# Patient Record
Sex: Female | Born: 1980
Health system: Southern US, Community
[De-identification: ages and names within clinical notes are randomized; demographics above are authoritative.]

## PROBLEM LIST (undated history)

## (undated) ENCOUNTER — Emergency Department (HOSPITAL_COMMUNITY): Discharge status: Absent without leave | Payer: Self-pay

## (undated) DIAGNOSIS — K625 Hemorrhage of anus and rectum: Secondary | ICD-10-CM

## (undated) DIAGNOSIS — Z8719 Personal history of other diseases of the digestive system: Secondary | ICD-10-CM

## (undated) DIAGNOSIS — A599 Trichomoniasis, unspecified: Secondary | ICD-10-CM

## (undated) DIAGNOSIS — I499 Cardiac arrhythmia, unspecified: Secondary | ICD-10-CM

## (undated) DIAGNOSIS — K5900 Constipation, unspecified: Secondary | ICD-10-CM

## (undated) HISTORY — PX: BREAST SURGERY: SHX581

## (undated) HISTORY — PX: OTHER SURGICAL HISTORY: SHX169

---

## 2004-02-28 ENCOUNTER — Emergency Department (HOSPITAL_COMMUNITY): Admission: EM | Admit: 2004-02-28 | Discharge: 2004-02-28 | Payer: Self-pay | Admitting: Emergency Medicine

## 2004-03-16 ENCOUNTER — Emergency Department (HOSPITAL_COMMUNITY): Admission: EM | Admit: 2004-03-16 | Discharge: 2004-03-16 | Payer: Self-pay | Admitting: Emergency Medicine

## 2004-06-30 ENCOUNTER — Emergency Department (HOSPITAL_COMMUNITY): Admission: EM | Admit: 2004-06-30 | Discharge: 2004-06-30 | Payer: Self-pay | Admitting: Emergency Medicine

## 2004-07-27 ENCOUNTER — Emergency Department (HOSPITAL_COMMUNITY): Admission: EM | Admit: 2004-07-27 | Discharge: 2004-07-27 | Payer: Self-pay | Admitting: Emergency Medicine

## 2004-08-10 ENCOUNTER — Emergency Department (HOSPITAL_COMMUNITY): Admission: EM | Admit: 2004-08-10 | Discharge: 2004-08-10 | Payer: Self-pay | Admitting: Emergency Medicine

## 2004-09-13 ENCOUNTER — Emergency Department (HOSPITAL_COMMUNITY): Admission: EM | Admit: 2004-09-13 | Discharge: 2004-09-13 | Payer: Self-pay | Admitting: Emergency Medicine

## 2004-09-23 ENCOUNTER — Emergency Department (HOSPITAL_COMMUNITY): Admission: EM | Admit: 2004-09-23 | Discharge: 2004-09-23 | Payer: Self-pay | Admitting: Emergency Medicine

## 2004-09-30 ENCOUNTER — Ambulatory Visit (HOSPITAL_COMMUNITY): Admission: RE | Admit: 2004-09-30 | Discharge: 2004-09-30 | Payer: Self-pay | Admitting: Family Medicine

## 2004-11-04 ENCOUNTER — Ambulatory Visit (HOSPITAL_COMMUNITY): Admission: RE | Admit: 2004-11-04 | Discharge: 2004-11-04 | Payer: Self-pay | Admitting: Obstetrics and Gynecology

## 2004-11-24 ENCOUNTER — Emergency Department (HOSPITAL_COMMUNITY): Admission: EM | Admit: 2004-11-24 | Discharge: 2004-11-24 | Payer: Self-pay | Admitting: Emergency Medicine

## 2004-11-30 ENCOUNTER — Emergency Department (HOSPITAL_COMMUNITY): Admission: EM | Admit: 2004-11-30 | Discharge: 2004-11-30 | Payer: Self-pay | Admitting: Emergency Medicine

## 2005-02-18 ENCOUNTER — Encounter: Admission: RE | Admit: 2005-02-18 | Discharge: 2005-02-18 | Payer: Self-pay | Admitting: Family Medicine

## 2005-03-02 ENCOUNTER — Emergency Department (HOSPITAL_COMMUNITY): Admission: EM | Admit: 2005-03-02 | Discharge: 2005-03-02 | Payer: Self-pay | Admitting: Emergency Medicine

## 2005-03-05 ENCOUNTER — Ambulatory Visit (HOSPITAL_BASED_OUTPATIENT_CLINIC_OR_DEPARTMENT_OTHER): Admission: RE | Admit: 2005-03-05 | Discharge: 2005-03-05 | Payer: Self-pay | Admitting: Ophthalmology

## 2005-08-12 ENCOUNTER — Emergency Department (HOSPITAL_COMMUNITY): Admission: EM | Admit: 2005-08-12 | Discharge: 2005-08-12 | Payer: Self-pay | Admitting: Emergency Medicine

## 2005-11-22 ENCOUNTER — Encounter (INDEPENDENT_AMBULATORY_CARE_PROVIDER_SITE_OTHER): Payer: Self-pay | Admitting: Internal Medicine

## 2005-11-22 LAB — CONVERTED CEMR LAB

## 2006-02-06 ENCOUNTER — Emergency Department (HOSPITAL_COMMUNITY): Admission: EM | Admit: 2006-02-06 | Discharge: 2006-02-06 | Payer: Self-pay | Admitting: Emergency Medicine

## 2006-03-05 ENCOUNTER — Emergency Department (HOSPITAL_COMMUNITY): Admission: EM | Admit: 2006-03-05 | Discharge: 2006-03-05 | Payer: Self-pay | Admitting: Emergency Medicine

## 2006-04-01 ENCOUNTER — Emergency Department (HOSPITAL_COMMUNITY): Admission: EM | Admit: 2006-04-01 | Discharge: 2006-04-01 | Payer: Self-pay | Admitting: Emergency Medicine

## 2006-05-15 ENCOUNTER — Ambulatory Visit: Payer: Self-pay | Admitting: Internal Medicine

## 2006-05-18 ENCOUNTER — Ambulatory Visit: Payer: Self-pay | Admitting: Internal Medicine

## 2006-05-19 ENCOUNTER — Ambulatory Visit: Payer: Self-pay | Admitting: *Deleted

## 2006-07-09 ENCOUNTER — Ambulatory Visit: Payer: Self-pay | Admitting: Internal Medicine

## 2006-07-23 ENCOUNTER — Ambulatory Visit: Payer: Self-pay | Admitting: Internal Medicine

## 2006-09-22 ENCOUNTER — Emergency Department (HOSPITAL_COMMUNITY): Admission: EM | Admit: 2006-09-22 | Discharge: 2006-09-22 | Payer: Self-pay | Admitting: Family Medicine

## 2006-10-22 ENCOUNTER — Ambulatory Visit (HOSPITAL_COMMUNITY): Admission: RE | Admit: 2006-10-22 | Discharge: 2006-10-22 | Payer: Self-pay | Admitting: Internal Medicine

## 2006-10-22 ENCOUNTER — Ambulatory Visit: Payer: Self-pay | Admitting: Internal Medicine

## 2006-10-23 ENCOUNTER — Encounter (INDEPENDENT_AMBULATORY_CARE_PROVIDER_SITE_OTHER): Payer: Self-pay | Admitting: Internal Medicine

## 2006-10-30 ENCOUNTER — Ambulatory Visit: Payer: Self-pay | Admitting: Internal Medicine

## 2006-11-03 ENCOUNTER — Encounter: Payer: Self-pay | Admitting: Internal Medicine

## 2006-11-13 ENCOUNTER — Ambulatory Visit: Payer: Self-pay | Admitting: Internal Medicine

## 2006-11-13 LAB — CONVERTED CEMR LAB
Chlamydia, DNA Probe: NEGATIVE
GC Probe Amp, Genital: NEGATIVE

## 2006-12-09 ENCOUNTER — Encounter (INDEPENDENT_AMBULATORY_CARE_PROVIDER_SITE_OTHER): Payer: Self-pay | Admitting: *Deleted

## 2007-02-12 ENCOUNTER — Ambulatory Visit: Payer: Self-pay | Admitting: Internal Medicine

## 2007-02-12 LAB — CONVERTED CEMR LAB
Bilirubin Urine: NEGATIVE
Glucose, Urine, Semiquant: NEGATIVE
Ketones, urine, test strip: NEGATIVE
Nitrite: NEGATIVE
Protein, U semiquant: NEGATIVE
Specific Gravity, Urine: 1.015
Urobilinogen, UA: NEGATIVE
WBC Urine, dipstick: NEGATIVE
pH: 6.5

## 2007-02-23 ENCOUNTER — Telehealth (INDEPENDENT_AMBULATORY_CARE_PROVIDER_SITE_OTHER): Payer: Self-pay | Admitting: Internal Medicine

## 2007-05-27 ENCOUNTER — Ambulatory Visit: Payer: Self-pay | Admitting: Internal Medicine

## 2007-09-03 ENCOUNTER — Emergency Department (HOSPITAL_COMMUNITY): Admission: EM | Admit: 2007-09-03 | Discharge: 2007-09-03 | Payer: Self-pay | Admitting: Family Medicine

## 2007-09-14 ENCOUNTER — Ambulatory Visit: Payer: Self-pay | Admitting: Gastroenterology

## 2007-09-15 ENCOUNTER — Encounter: Payer: Self-pay | Admitting: Gastroenterology

## 2007-09-15 LAB — CONVERTED CEMR LAB
ALT: 19 units/L (ref 0–35)
AST: 19 units/L (ref 0–37)
Albumin: 4.2 g/dL (ref 3.5–5.2)
Alkaline Phosphatase: 63 units/L (ref 39–117)
BUN: 15 mg/dL (ref 6–23)
Basophils Absolute: 0 10*3/uL (ref 0.0–0.1)
Basophils Relative: 0 % (ref 0.0–1.0)
CO2: 25 meq/L (ref 19–32)
Calcium: 9.4 mg/dL (ref 8.4–10.5)
Chloride: 106 meq/L (ref 96–112)
Creatinine, Ser: 0.8 mg/dL (ref 0.4–1.2)
Eosinophils Absolute: 0.1 10*3/uL (ref 0.0–0.7)
Eosinophils Relative: 1.4 % (ref 0.0–5.0)
GFR calc Af Amer: 112 mL/min
GFR calc non Af Amer: 92 mL/min
Glucose, Bld: 96 mg/dL (ref 70–99)
HCT: 38.4 % (ref 36.0–46.0)
Hemoglobin: 13.4 g/dL (ref 12.0–15.0)
Lymphocytes Relative: 34 % (ref 12.0–46.0)
MCHC: 35 g/dL (ref 30.0–36.0)
MCV: 83.9 fL (ref 78.0–100.0)
Monocytes Absolute: 0.4 10*3/uL (ref 0.1–1.0)
Monocytes Relative: 8.5 % (ref 3.0–12.0)
Neutro Abs: 2.4 10*3/uL (ref 1.4–7.7)
Neutrophils Relative %: 56.1 % (ref 43.0–77.0)
Platelets: 188 10*3/uL (ref 150–400)
Potassium: 3.4 meq/L — ABNORMAL LOW (ref 3.5–5.1)
RBC: 4.58 M/uL (ref 3.87–5.11)
RDW: 12.9 % (ref 11.5–14.6)
Sodium: 142 meq/L (ref 135–145)
TSH: 0.77 microintl units/mL (ref 0.35–5.50)
Total Bilirubin: 1 mg/dL (ref 0.3–1.2)
Total Protein: 8.2 g/dL (ref 6.0–8.3)
WBC: 4.4 10*3/uL — ABNORMAL LOW (ref 4.5–10.5)

## 2007-10-29 ENCOUNTER — Ambulatory Visit: Payer: Self-pay | Admitting: Gastroenterology

## 2007-11-22 ENCOUNTER — Other Ambulatory Visit: Admission: RE | Admit: 2007-11-22 | Discharge: 2007-11-22 | Payer: Self-pay | Admitting: Obstetrics and Gynecology

## 2007-11-23 ENCOUNTER — Ambulatory Visit: Payer: Self-pay | Admitting: Gastroenterology

## 2007-11-23 ENCOUNTER — Encounter: Payer: Self-pay | Admitting: Gastroenterology

## 2007-11-25 ENCOUNTER — Encounter: Payer: Self-pay | Admitting: Gastroenterology

## 2008-01-07 ENCOUNTER — Ambulatory Visit: Payer: Self-pay | Admitting: Gastroenterology

## 2008-04-10 ENCOUNTER — Emergency Department (HOSPITAL_COMMUNITY): Admission: EM | Admit: 2008-04-10 | Discharge: 2008-04-10 | Payer: Self-pay | Admitting: Emergency Medicine

## 2008-05-25 ENCOUNTER — Emergency Department (HOSPITAL_COMMUNITY): Admission: EM | Admit: 2008-05-25 | Discharge: 2008-05-25 | Payer: Self-pay | Admitting: Emergency Medicine

## 2009-03-26 LAB — CONVERTED CEMR LAB: Pap Smear: NORMAL

## 2009-05-14 ENCOUNTER — Emergency Department (HOSPITAL_COMMUNITY): Admission: EM | Admit: 2009-05-14 | Discharge: 2009-05-14 | Payer: Self-pay | Admitting: Emergency Medicine

## 2009-05-20 ENCOUNTER — Emergency Department (HOSPITAL_COMMUNITY): Admission: EM | Admit: 2009-05-20 | Discharge: 2009-05-20 | Payer: Self-pay | Admitting: Family Medicine

## 2009-05-29 ENCOUNTER — Emergency Department (HOSPITAL_COMMUNITY): Admission: EM | Admit: 2009-05-29 | Discharge: 2009-05-29 | Payer: Self-pay | Admitting: Emergency Medicine

## 2009-06-01 ENCOUNTER — Emergency Department (HOSPITAL_COMMUNITY): Admission: EM | Admit: 2009-06-01 | Discharge: 2009-06-02 | Payer: Self-pay | Admitting: Emergency Medicine

## 2009-06-04 ENCOUNTER — Ambulatory Visit: Payer: Self-pay | Admitting: Family Medicine

## 2009-06-04 ENCOUNTER — Encounter: Payer: Self-pay | Admitting: Family Medicine

## 2009-06-04 DIAGNOSIS — G4700 Insomnia, unspecified: Secondary | ICD-10-CM | POA: Insufficient documentation

## 2009-06-05 LAB — CONVERTED CEMR LAB: TSH: 1.081 microintl units/mL (ref 0.350–4.500)

## 2009-06-14 ENCOUNTER — Ambulatory Visit: Payer: Self-pay | Admitting: Family Medicine

## 2009-09-21 ENCOUNTER — Ambulatory Visit: Payer: Self-pay | Admitting: Family Medicine

## 2009-09-21 DIAGNOSIS — N390 Urinary tract infection, site not specified: Secondary | ICD-10-CM | POA: Insufficient documentation

## 2009-09-21 DIAGNOSIS — N899 Noninflammatory disorder of vagina, unspecified: Secondary | ICD-10-CM | POA: Insufficient documentation

## 2009-09-21 LAB — CONVERTED CEMR LAB
Bilirubin Urine: NEGATIVE
Glucose, Urine, Semiquant: NEGATIVE
Nitrite: NEGATIVE
Protein, U semiquant: 30
Specific Gravity, Urine: >=1.03
Urobilinogen, UA: 0.2
WBC Urine, dipstick: NEGATIVE
pH: 6

## 2009-09-22 ENCOUNTER — Encounter: Payer: Self-pay | Admitting: Family Medicine

## 2009-10-03 ENCOUNTER — Ambulatory Visit: Payer: Self-pay | Admitting: Family Medicine

## 2010-01-31 ENCOUNTER — Emergency Department (HOSPITAL_COMMUNITY): Admission: EM | Admit: 2010-01-31 | Discharge: 2010-01-31 | Payer: Self-pay | Admitting: Family Medicine

## 2010-03-19 ENCOUNTER — Emergency Department (HOSPITAL_COMMUNITY)
Admission: EM | Admit: 2010-03-19 | Discharge: 2010-03-19 | Payer: Self-pay | Source: Home / Self Care | Admitting: Family Medicine

## 2010-04-16 ENCOUNTER — Emergency Department (HOSPITAL_COMMUNITY)
Admission: EM | Admit: 2010-04-16 | Discharge: 2010-04-16 | Payer: Self-pay | Source: Home / Self Care | Admitting: Family Medicine

## 2010-04-23 NOTE — Assessment & Plan Note (Signed)
Summary: 30yo F new pt- insomnia   Vital Signs:  Patient profile:   30 year old female Height:      63.5 inches Weight:      149.4 pounds BMI:     26.14 Temp:     98.9 degrees F oral Pulse rate:   90 / minute BP sitting:   113 / 76  (left arm) Cuff size:   regular  Vitals Entered By: Gladstone Pih (June 04, 2009 3:20 PM) CC: NP get established, having problems sleeping since mid Jan 2011 Is Patient Diabetic? No Pain Assessment Patient in pain? no        Primary Care Provider:  Marisue Ivan, MD  CC:  NP get established and having problems sleeping since mid Jan 2011.  History of Present Illness: 30yo F here to establish care  Concerns:  Insomnia: Difficult falling asleep since middle of Jan 2011.  Occurs every night.  Has been taking off-brand Unisom and occasional Tylenol PM  to help with sleep.  She has noticed worsening headaches since not being able to sleep.  She has had a CT scan of the head that was normal.  Good sleep hygiene endorse.  No caffeine or exercise in the evening.    Preventative: [facility]    Habits & Providers  Alcohol-Tobacco-Diet     Tobacco Status: never  Current Medications (verified): 1)  None  Allergies (verified): No Known Drug Allergies  Past History:  Past Medical History: Hx of IBS- resolved (Colonoscopy 11/2007) G1P1001  Social History: Lives in [place] with son, born in 2003 Works at [employer], Nature conservation officer nonsmoker nondrinker no drug use Occasional exercise  Review of Systems  The patient denies fever, chest pain, and syncope.    Physical Exam  General:  VS Reviewed. Well appearing, NAD.  Eyes:  no injected conjunctiva Neck:  supple, full ROM, no goiter or mass  Lungs:  Normal respiratory effort, chest expands symmetrically. Lungs are clear to auscultation, no crackles or wheezes. Heart:  Normal rate and regular rhythm. S1 and S2 normal without gallop, murmur, click, rub or  other extra sounds. Abdomen:  Soft, NT, ND, no HSM, active BS  Extremities:  no edema Neurologic:  no focal deficits Skin:  nl color and turgor   Impression & Recommendations:  Problem # 1:  HEALTH MAINTENANCE EXAM (ICD-V70.0) Assessment Unchanged Nl exam.   Will obtain [facility] from [facility] Seems to be up to date on pap smear.  Problem # 2:  INSOMNIA (ICD-780.52) Assessment: Deteriorated Lengthy discussion regarding sleep habits. Plan to place on sleep regimen.  Inc exercise.  Will start melatonin per pt request.  Ambien as needed. check TSH f/u in 2 weeks.  Her updated medication list for this problem includes:    Ambien 5 Mg Tabs (Zolpidem tartrate) .Marland Kitchen... 1 tablet by mouth at bedtime for insomnia  Orders: TSH-[facility] ([id number])  Complete Medication List: 1)  Depo Injection  .... Injection every 3 months 2)  Melatonin 5 Mg Tabs (Melatonin) .Marland Kitchen.. 1 tab by mouth at bedtime 3)  Ambien 5 Mg Tabs (Zolpidem tartrate) .Marland Kitchen.. 1 tablet by mouth at bedtime for insomnia  Other Orders: Tdap => 13yrs IM (57322) Admin 1st Vaccine (02542)  Patient Instructions: 1)  Please schedule a follow-up appointment in 2 weeks.  2)  I started you on Melatonin and ambien for the insomnia. 3)  I want you to try to exercise at least 30 minutes but ideally 1 hour  each day.   4)  Avoid doing anything besides sleeping in bed. 5)  Go to bed at the same time each night.   Prescriptions: AMBIEN 5 MG TABS (ZOLPIDEM TARTRATE) 1 tablet by mouth at bedtime for insomnia  #30 x 1   Entered and Authorized by:   Marisue Ivan  MD   Signed by:   Marisue Ivan  MD on 06/04/2009   Method used:   Print then Give to Patient   RxID:   [id number] MELATONIN 5 MG TABS (MELATONIN) 1 tab by mouth at bedtime  #30 x 1   Entered and Authorized by:   Marisue Ivan  MD   Signed by:   Marisue Ivan  MD on 06/04/2009   Method used:   Electronically to        [facility] [id number] (retail)        [address] [place], [place]  [address]       Ph: [phone]       Fax: [phone]   RxID:   [id number]    Prevention & Chronic Care Immunizations   Influenza vaccine: refused  (06/04/2009)   Influenza vaccine deferral: Refused  (06/04/2009)   Influenza vaccine due: Refused  (06/04/2009)    Tetanus booster: 06/04/2009: Tdap    Pneumococcal vaccine: Not documented  Other Screening   Pap smear: normal  (03/26/2009)   Pap smear due: 03/26/2010   Smoking status: never  (06/04/2009)   Nursing Instructions: Give tetanus booster today    Flu Vaccine Result Date:  06/04/2009 Flu Vaccine Result:  refused Flu Vaccine Next Due:  Refused Last PAP:  Done (11/22/2005 5:39:59 PM) PAP Result Date:  03/26/2009 PAP Result:  normal    Immunizations Administered:  Tetanus Vaccine:    Vaccine Type: Tdap    Site: right deltoid    Mfr: GlaxoSmithKline    Dose: 0.5 ml    Given by: Gladstone Pih    Exp. Date: 06/16/2011    Lot #: [id number]    VIS given: 02/09/07 version given June 04, 2009.    Physician counseled: yes

## 2010-04-23 NOTE — Assessment & Plan Note (Signed)
Summary: urethral irritation   Vital Signs:  Patient profile:   30 year old female Weight:      141.1 pounds Temp:     98.1 degrees F oral Pulse rate:   78 / minute BP sitting:   135 / 69  (left arm)  Vitals Entered By: Loralee Pacas CMA (October 03, 2009 1:57 PM) CC: Urinary Irritation, Flamily Planning Comments pt states that she is still having the vaginal issues.  it's still red and irritated when she urinates esp. when she wipes. it also bothers her when she is just sitting around it is uncomfortable.  there has been no trauma to this area   Primary Care Provider:  Jamie Brookes MD  CC:  Urinary Irritation and Flamily Planning.  History of Present Illness: Urinary Irritation: Pt comes back in today with similar symptoms that she had before. She is still red and irritated and having pain when she wipes herself and when she urinates. She has not had any trauma. No sexual relations since Feb. Symptoms started in March.   Family Planning: Pt says she is considering getting a different type of contraception starting in January. Thinking about having a child again someday but not sure when. Discussed implanon.   Habits & Providers  Alcohol-Tobacco-Diet     Tobacco Status: never  Current Medications (verified): 1)  Depo Injection .... Injection Every 3 Months 2)  Imipramine Hcl 25 Mg Tabs (Imipramine Hcl) .Marland Kitchen.. 1 Tab By Mouth Daily...titrate To 50mg  Per Md 3)  Terconazole 0.8 % Crea (Terconazole) .... Apply A Small Amount of The Cream To The Opening Around The Urethra.  Give 1 Large Tube  Allergies (verified): No Known Drug Allergies  Review of Systems        vitals reviewed and pertinent negatives and positives seen in HPI   Physical Exam  General:  Well-developed,well-nourished,in no acute distress; alert,appropriate and cooperative throughout examination Genitalia:  2 tiny pinpoint red dots and erythema around the opening of the urethra. very tender.  Psych:  Cognition  and judgment appear intact. Alert and cooperative with normal attention span and concentration. No apparent delusions, illusions, hallucinations   Impression & Recommendations:  Problem # 1:  UNSPECIFIED NONINFLAMMATORY DISORDER OF VAGINA (ICD-623.9) Assessment Deteriorated Considering canidia glabrata species as cause of irritation. Will try Teraconazole. Pt thinks she has tried this before but it willing to try it again.   Orders: [facility]- Est Level  3 ([id number])  Complete Medication List: 1)  Depo Injection  .... Injection every 3 months 2)  Imipramine Hcl 25 Mg Tabs (Imipramine hcl) .Marland Kitchen.. 1 tab by mouth daily...titrate to 50mg  per md 3)  Terconazole 0.8 % Crea (Terconazole) .... Apply a small amount of the cream to the opening around the urethra.  give 1 large tube  Patient Instructions: 1)  This may be caused by a different kind of yeast.  2)  Use the Teraconazole for 1 week and if symptoms are not relieved use for 1 more week.   3)  Come back to see me or make an appointment to see your GYN MD if not feeling a little better in the next 2 weeks.  Prescriptions: TERCONAZOLE 0.8 % CREA (TERCONAZOLE) apply a small amount of the cream to the opening around the urethra.  Give 1 large tube  #1 x 3   Entered and Authorized by:   Jamie Brookes MD   Signed by:   Jamie Brookes MD on 10/03/2009   Method used:  Electronically to        [facility] [id number] (retail)       [address] [place], [place]  [address]       Ph: [phone]       Fax: [phone]   RxID:   [id number]

## 2010-04-23 NOTE — Assessment & Plan Note (Signed)
Summary: Vaginal discomfort, Family planning   Vital Signs:  Patient profile:   30 year old female Weight:      142.9 pounds Temp:     99.5 degrees F oral Pulse rate:   96 / minute Pulse rhythm:   regular BP sitting:   108 / 72  (right arm) Cuff size:   regular  Vitals Entered By: Loralee Pacas CMA (September 21, 2009 3:13 PM)  Primary Care Provider:  Jamie Brookes MD  CC:  urinary irritation.  History of Present Illness: Urinary irritation: Pt has a long history of urinary irritation. She describes an uncomfortable feeling starting in March. She went to [facility]) and being diagnosed with a UTI. She was treated but did not feel better, she was then diagnosed with a yeast infection and treated with Diflucan. She was then diagnosed with BV and treated with Metrogel. She still had the irritation and was given 2 different creams, told to use Aveno baths and still has the irritation. She says she has had an "oder" for a long time that makes her embarrassed to even sit beside people because she is just sure they smell her vaginal oder. 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    Contraception: pt is currently using the Depo shots for contraceptions. She has been using them for > 5 years. Discussed using another method because of bone thinning. She says she knows the risks of bone thinning but wants to continue using them. Told her about other options. She will think about them.   Current Medications (verified): 1)  Depo Injection .... Injection Every 3 Months 2)  Imipramine Hcl 25 Mg Tabs (Imipramine Hcl) .Marland Kitchen.. 1 Tab By Mouth Daily...titrate To 50mg  Per Md  Allergies (verified): No Known Drug Allergies  Review of Systems        vitals reviewed and pertinent negatives and positives seen in HPI   Physical Exam  General:  Well-developed,well-nourished,in no acute distress; alert,appropriate and cooperative throughout examination Genitalia:  Normal introitus for  age, no external lesions, no vaginal discharge, mucosa pink and moist, no vaginal or cervical lesions, no vaginal atrophy, no friaility or hemorrhage, normal uterus size and position, no adnexal masses or tenderness, normal vaginal oder present   Impression & Recommendations:  Problem # 1:  UNSPECIFIED NONINFLAMMATORY DISORDER OF VAGINA (ICD-623.9) Assessment New UA was inconclusive for UTI. No oder smelled on exam, pt has been diagnosed with several different infections and been treated with several different kinds of treatments over the last 4 months. Not sure that there is a real irritation but may be a change in vaginal pH. Plan to have her do baking soda baths and see me back in 2 weeks to see if she is getting better. UA sent off for culture.   Orders: [facility]- Est Level  3 ([address]109)  Problem # 2:  FAMILY PLANNING (ICD-V25.09) Assessment: Unchanged Discussed wtih patient other options for contraceptions. She has been on Depo for too long. Pt is thinking about nuva ring. Will discuss further in 2 weeks at next appointment.   Orders: [facility]- Est Level  3 ([id number])  Complete Medication List: 1)  Depo Injection  .... Injection every 3 months 2)  Imipramine Hcl 25 Mg Tabs (Imipramine hcl) .Marland Kitchen.. 1 tab by mouth daily...titrate to 50mg  per md  Other Orders: Urinalysis-[facility] (00000) Urine Culture-[facility] ([id number])  Patient Instructions: 1)  It was really nice to meet you today.  2)  Thank you  for telling me your story. Hopefully this will help. 3)  You can look up several different ways to do baking soda baths but this is what I suggest: fill tub to the level to cover your legs, add 1/2 to 1 box of baking soda to the water, mix water. Sit in water for 20 minutes twice a day for 2 weeks, then come back to see me. If it works well you can just continue the baths twice a week.  4)  I will see you again in 2 weeks.   Laboratory Results   Urine Tests  Date/Time Received: September 21, 2009 3:17  PM  Date/Time Reported: September 21, 2009 4:01 PM   Routine Urinalysis   Color: yellow Appearance: Clear Glucose: negative   (Normal Range: Negative) Bilirubin: negative   (Normal Range: Negative) Ketone: trace (5)   (Normal Range: Negative) Spec. Gravity: >=1.030   (Normal Range: 1.003-1.035) Blood: small   (Normal Range: Negative) pH: 6.0   (Normal Range: 5.0-8.0) Protein: 30   (Normal Range: Negative) Urobilinogen: 0.2   (Normal Range: 0-1) Nitrite: negative   (Normal Range: Negative) Leukocyte Esterace: negative   (Normal Range: Negative)  Urine Microscopic WBC/HPF: 1-5 RBC/HPF: 5-10 Bacteria/HPF: 1+ Mucous/HPF: 2+ Epithelial/HPF: 1-5    Comments: urine sent for culture ...............test performed by......Marland KitchenBonnie A. Sw[place], MLS (ASCP)cm

## 2010-04-23 NOTE — Assessment & Plan Note (Signed)
Summary: Insomnia- no change   Vital Signs:  Patient profile:   30 year old female Height:      63.5 inches Weight:      147.0 pounds BMI:     25.72 Temp:     99.5 degrees F oral Pulse rate:   97 / minute BP sitting:   104 / 69  (left arm) Cuff size:   regular  Vitals Entered By: Gladstone Pih (June 14, 2009 4:13 PM) CC: F/U Is Patient Diabetic? No Pain Assessment Patient in pain? no        Primary Care Provider:  Marisue Ivan, MD  CC:  F/U.  History of Present Illness: 30yo F here for f/u insomnia  Insomnia: Tried the Ambien and melatonin x 1 week.  No drastic effect.  Now on imipramine per headache clinic and states that headache is better but sleep not drastically better.  No adverse effects.  She was on a sleep regimen x 1 week.  Has not increased exercise regimen.  Habits & Providers  Alcohol-Tobacco-Diet     Tobacco Status: never  Current Medications (verified): 1)  Depo Injection .... Injection Every 3 Months 2)  Imipramine Hcl 25 Mg Tabs (Imipramine Hcl) .Marland Kitchen.. 1 Tab By Mouth Daily...titrate To 50mg  Per Md  Allergies (verified): No Known Drug Allergies  Physical Exam  General:  VS Reviewed. Well appearing, NAD.    Impression & Recommendations:  Problem # 1:  INSOMNIA (ICD-780.52) Assessment Unchanged  Pt no longer on Ambien or melatonin.   It is possible that if her headaches continue to improve, her sleep may also improve. Emphasized good sleep hygiene and daily exercise.  Plan to f/u as needed if insomnia doesn't improve.  The following medications were removed from the medication list:    Ambien 5 Mg Tabs (Zolpidem tartrate) .Marland Kitchen... 1 tablet by mouth at bedtime for insomnia  Orders: [facility]- Est Level  3 ([id number])  Complete Medication List: 1)  Depo Injection  .... Injection every 3 months 2)  Imipramine Hcl 25 Mg Tabs (Imipramine hcl) .Marland Kitchen.. 1 tab by mouth daily...titrate to 50mg  per md  Patient Instructions: 1)  Please schedule a follow-up  appointment as needed if symptoms not improving.   2)  Please get into a sleep and exercise regimen.

## 2010-04-24 ENCOUNTER — Encounter: Payer: Self-pay | Admitting: *Deleted

## 2010-05-02 ENCOUNTER — Encounter (INDEPENDENT_AMBULATORY_CARE_PROVIDER_SITE_OTHER): Payer: Self-pay | Admitting: Internal Medicine

## 2010-05-02 ENCOUNTER — Encounter: Payer: Self-pay | Admitting: Internal Medicine

## 2010-05-02 DIAGNOSIS — R51 Headache: Secondary | ICD-10-CM | POA: Insufficient documentation

## 2010-05-02 DIAGNOSIS — R519 Headache, unspecified: Secondary | ICD-10-CM | POA: Insufficient documentation

## 2010-05-09 NOTE — Assessment & Plan Note (Signed)
Vital Signs:  Patient profile:   30 year old female Menstrual status:  depo Height:      63.5 inches Weight:      137.7 pounds BMI:     24.10 Temp:     98.3 degrees F oral Pulse rate:   72 / minute Pulse rhythm:   regular Resp:     18 per minute BP sitting:   98 / 60  (left arm) Cuff size:   regular  Vitals Entered By: [place] Shannon (May 02, 2010 8:44 AM) CC: pt says she is not sleeping...Marland KitchenMarland Kitchen pt would like to know if she can get a med to help her... Is Patient Diabetic? No Pain Assessment Patient in pain? no       Does patient need assistance? Functional Status Self care Ambulation Normal     Menstrual Status depo Last PAP Result normal   Primary Care Provider:  Jamie Brookes MD  CC:  pt says she is not sleeping...Marland KitchenMarland Kitchen pt would like to know if she can get a med to help her....  History of Present Illness: 1.  Insomnia:  started last year out of the blue.  Noted was having headaches associated with this.  Has been to ED and had scans of head.  [facility]  Was placed on Imipramine, which helped with headache.  Not sure if helped with sleep.  Has been off Imipramine since May of 2011.  Did not take it regularly when she did take  Difficulties initiating sleep and problems with awakening and not being able to get back to sleep.  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  Lives alone.  58 year old son sometimes with her, sometimes with his grandmother.  If no errands, watches TV much of day.  Just started back to gym.  Goes 2-3 times per week.  Tries to go to bed at 9 p.m., though gets sleepy around 6 p.m., but tries to stay busy until 9 p.m. to stay awake.  Usually up at 6:30 in morning.  When slept well previously, if got 7 hours of sleep, was well rested in the morning.   If unable to fall asleep, just lies in bed and gets frustrated--or turns TV on and  watches for a while. Rare caffeine or chocolate. Nonsmoker.   No alcohol.  Exercises around 1 p.m. Rarely outside for sun exposure.    2.  Headaches:  2- 3  times weekly--especially on nights when does not sleep.   Current Medications (verified): 1)  Depo Injection .... Injection Every 3 Months 2)  Imipramine Hcl 25 Mg Tabs (Imipramine Hcl) .Marland Kitchen.. 1 Tab By Mouth Daily...titrate To 50mg  Per Md 3)  Terconazole 0.8 % Crea (Terconazole) .... Apply A Small Amount of The Cream To The Opening Around The Urethra.  Give 1 Large Tube  Allergies (verified): No Known Drug Allergies  Physical Exam  General:  NAD Eyes:  PERRL, EOMI, discs sharp Lungs:  Normal respiratory effort, chest expands symmetrically. Lungs are clear to auscultation, no crackles or wheezes. Heart:  Normal rate and regular rhythm. S1 and S2 normal without gallop, murmur, click, rub or other extra sounds. Neurologic:  alert & oriented X3 and cranial nerves II-XII intact.     Impression & Recommendations:  Problem # 1:  INSOMNIA (ICD-780.52) Discussed good sleep hygiene, exercise regimen. Restart Imipramine  Problem #  2:  HEADACHE (ICD-784.0) Sounds related to lack of sleep-restart Imipramine. Send for records.  Complete Medication List: 1)  Depo Injection  .... Injection every 3 months 2)  Imipramine Hcl 25 Mg Tabs (Imipramine hcl) .Marland Kitchen.. 1 tab by mouth at bedtime for 3 days, then increase to 2 tabs at bedtime.  Patient Instructions: 1)  Exercise daily 2)  Daily sun exposure--30 minutes 3)  Avoid TV in evening or when cannot sleep 4)  Calming routine prior to bedtime. 5)  Follow up with Dr. Delrae Alfred in 3 months --headaches and insomnia. Prescriptions: IMIPRAMINE HCL 25 MG TABS (IMIPRAMINE HCL) 1 tab by mouth at bedtime for 3 days, then increase to 2 tabs at bedtime.  #60 x 6   Entered and Authorized by:   Julieanne Manson MD   Signed by:   Julieanne Manson MD on 05/02/2010   Method used:   Faxed to ...        [facility] (retail)       [address] [place], [place]  [address]       Ph: [phone]       Fax: [phone]   RxID:   [id number]    Orders Added: 1)  Est. Patient Level III [phone]

## 2010-05-25 ENCOUNTER — Inpatient Hospital Stay (INDEPENDENT_AMBULATORY_CARE_PROVIDER_SITE_OTHER)
Admission: RE | Admit: 2010-05-25 | Discharge: 2010-05-25 | Disposition: A | Discharge status: Home / Self Care | Payer: Managed Care, Other (non HMO) | Source: Ambulatory Visit | Attending: Family Medicine | Admitting: Family Medicine

## 2010-05-25 DIAGNOSIS — J069 Acute upper respiratory infection, unspecified: Secondary | ICD-10-CM

## 2010-06-03 LAB — GC/CHLAMYDIA PROBE AMP, GENITAL
Chlamydia, DNA Probe: UNDETERMINED
GC Probe Amp, Genital: NEGATIVE

## 2010-06-03 LAB — POCT URINALYSIS DIPSTICK
Bilirubin Urine: NEGATIVE
Glucose, UA: NEGATIVE mg/dL
Ketones, ur: NEGATIVE mg/dL
Nitrite: NEGATIVE
Protein, ur: NEGATIVE mg/dL
Specific Gravity, Urine: 1.02 (ref 1.005–1.030)
Urobilinogen, UA: 0.2 mg/dL (ref 0.0–1.0)
pH: 6.5 (ref 5.0–8.0)

## 2010-06-03 LAB — URINE CULTURE
Colony Count: NO GROWTH
Culture  Setup Time: 201112271317
Culture: NO GROWTH

## 2010-06-03 LAB — POCT PREGNANCY, URINE: Preg Test, Ur: NEGATIVE

## 2010-06-03 LAB — WET PREP, GENITAL
Clue Cells Wet Prep HPF POC: NONE SEEN
Trich, Wet Prep: NONE SEEN
Yeast Wet Prep HPF POC: NONE SEEN

## 2010-06-04 LAB — POCT URINALYSIS DIPSTICK
Bilirubin Urine: NEGATIVE
Glucose, UA: NEGATIVE mg/dL
Ketones, ur: NEGATIVE mg/dL
Protein, ur: NEGATIVE mg/dL

## 2010-06-04 LAB — WET PREP, GENITAL
Clue Cells Wet Prep HPF POC: NONE SEEN
Trich, Wet Prep: NONE SEEN
Yeast Wet Prep HPF POC: NONE SEEN

## 2010-06-04 LAB — GC/CHLAMYDIA PROBE AMP, GENITAL
Chlamydia, DNA Probe: NEGATIVE
GC Probe Amp, Genital: NEGATIVE

## 2010-06-04 LAB — POCT PREGNANCY, URINE: Preg Test, Ur: NEGATIVE

## 2010-07-04 LAB — POCT RAPID STREP A (OFFICE): Streptococcus, Group A Screen (Direct): POSITIVE — AB

## 2010-08-09 NOTE — Op Note (Signed)
[name], [name]            ACCOUNT NO.:  [id number]   MEDICAL RECORD NO.:  [id number]           PATIENT TYPE:   LOCATION:                                 FACILITY:   PHYSICIAN:  Salley Scarlet., M.D. DATE OF BIRTH:   DATE OF PROCEDURE:  DATE OF DISCHARGE:                                 OPERATIVE REPORT   PREOPERATIVE DIAGNOSIS:  Chalazion lower lid left eye.   POSTOPERATIVE DIAGNOSIS:  Chalazion lower lid left eye.   OPERATION:  Chalazion excision.   ANESTHESIA:  Local using Xylocaine 1%.   PROCEDURE:  The lower lid of the left eyelid was inspected. There was found  a moderate size chalazion located along the central aspect of the lower lid  of the left eye. The patient was prepped and draped in the usual manner. A  chalazion clamp was applied over the lesion and the lid was reverted. A  cruciate incision was made in the tarsus over the lesion and the lesion was  curetted using the chalazion curet. The site was incised in toto using sharp  and blunt dissection. Cortisporin ophthalmic ointment and a pressure patch  was applied. The patient tolerated the procedure well and was discharged in  satisfactory condition with instructions to take Tylenol every 4 hours as  needed for pain today, to keep the eye patch overnight and to remove it  tomorrow. She is to resume her drops when she removes the patch and she is  to see me in the office in 1 week.   DISCHARGE DIAGNOSIS:  Chalazion lower lid.      Salley Scarlet., M.D.  Electronically Signed     TB/MEDQ  D:  03/06/2005  T:  03/07/2005  Job:  [id number]

## 2010-09-24 ENCOUNTER — Inpatient Hospital Stay (HOSPITAL_COMMUNITY): Payer: Managed Care, Other (non HMO)

## 2010-09-24 ENCOUNTER — Inpatient Hospital Stay (HOSPITAL_COMMUNITY)
Admission: AD | Admit: 2010-09-24 | Discharge: 2010-09-24 | Disposition: A | Discharge status: Home / Self Care | Payer: Managed Care, Other (non HMO) | Source: Ambulatory Visit | Attending: Obstetrics and Gynecology | Admitting: Obstetrics and Gynecology

## 2010-09-24 DIAGNOSIS — R1031 Right lower quadrant pain: Secondary | ICD-10-CM

## 2010-09-24 DIAGNOSIS — R109 Unspecified abdominal pain: Secondary | ICD-10-CM | POA: Insufficient documentation

## 2010-09-24 LAB — WET PREP, GENITAL
Clue Cells Wet Prep HPF POC: NONE SEEN
WBC, Wet Prep HPF POC: NONE SEEN

## 2010-09-24 LAB — URINALYSIS, ROUTINE W REFLEX MICROSCOPIC
Bilirubin Urine: NEGATIVE
Ketones, ur: 15 mg/dL — AB
Nitrite: NEGATIVE
pH: 7 (ref 5.0–8.0)

## 2010-09-24 LAB — POCT PREGNANCY, URINE: Preg Test, Ur: NEGATIVE

## 2010-09-24 LAB — URINE MICROSCOPIC-ADD ON

## 2011-01-24 ENCOUNTER — Inpatient Hospital Stay (INDEPENDENT_AMBULATORY_CARE_PROVIDER_SITE_OTHER)
Admission: RE | Admit: 2011-01-24 | Discharge: 2011-01-24 | Disposition: A | Discharge status: Home / Self Care | Payer: Managed Care, Other (non HMO) | Source: Ambulatory Visit | Attending: Family Medicine | Admitting: Family Medicine

## 2011-01-24 DIAGNOSIS — N760 Acute vaginitis: Secondary | ICD-10-CM

## 2011-01-24 LAB — POCT URINALYSIS DIP (DEVICE)
Glucose, UA: NEGATIVE mg/dL
Glucose, UA: NEGATIVE mg/dL
Nitrite: NEGATIVE
Nitrite: NEGATIVE
Protein, ur: NEGATIVE mg/dL
Protein, ur: NEGATIVE mg/dL
Urobilinogen, UA: 1 mg/dL (ref 0.0–1.0)
Urobilinogen, UA: 0.2 mg/dL (ref 0.0–1.0)
pH: 7 (ref 5.0–8.0)
pH: 7 (ref 5.0–8.0)

## 2011-01-25 LAB — GC/CHLAMYDIA PROBE AMP, GENITAL
Chlamydia, DNA Probe: NEGATIVE
GC Probe Amp, Genital: NEGATIVE

## 2011-01-28 NOTE — ED Notes (Signed)
11/5 1415  Pt. called and requested her lab results. 1838  I called pt. back and verified x2. Pt. given her negative results. Pt. said she was treated for Trich with Flagyl.  Pt. had an exposure and had a vaginal discharge. Pt. asked if she still needed to take the medicine if wet prep neg? 11/6  Discussed with Dr. Lorenza Chick and he said yes. I called pt. back and gave her this information. Pt. States she has pressure in her lower abdomen and urinary frequency since she started the medication. I told her I did not think it was the medication. It sounds like symptoms of a UTI and recommended she come back to get rechecked.

## 2011-01-29 ENCOUNTER — Emergency Department (INDEPENDENT_AMBULATORY_CARE_PROVIDER_SITE_OTHER)
Admission: EM | Admit: 2011-01-29 | Discharge: 2011-01-29 | Disposition: A | Discharge status: Home / Self Care | Payer: 59 | Source: Home / Self Care | Attending: Emergency Medicine | Admitting: Emergency Medicine

## 2011-01-29 ENCOUNTER — Encounter (HOSPITAL_COMMUNITY): Payer: Self-pay | Admitting: Emergency Medicine

## 2011-01-29 DIAGNOSIS — N390 Urinary tract infection, site not specified: Secondary | ICD-10-CM

## 2011-01-29 HISTORY — DX: Trichomoniasis, unspecified: A59.9

## 2011-01-29 LAB — POCT URINALYSIS DIP (DEVICE)
Glucose, UA: NEGATIVE mg/dL
Specific Gravity, Urine: 1.02 (ref 1.005–1.030)

## 2011-01-29 MED ORDER — CEPHALEXIN 500 MG PO CAPS: 500.0000 mg | ORAL_CAPSULE | Freq: Four times a day (QID) | ORAL | Status: AC

## 2011-01-29 NOTE — ED Notes (Signed)
Pt seen here a few days ago and given Flagyl for possible trichomonas.  Pt states she called the other day and was told all labs were negative.  States she started with low abd pain and pressure about 2 days ago and last pm started with urinary frequency and burning.  States she did not have these symptoms when she was here last.

## 2011-01-29 NOTE — ED Provider Notes (Addendum)
History     CSN: [id number] Arrival date & time: 01/29/2011  8:19 AM   First MD Initiated Contact with Patient 01/29/11 0831      Chief Complaint  Patient presents with  . Urinary Tract Infection   Took the last dose of flagyl yesterday...was told my test were negative for trich...yesterday started with "burning and pressure with urination" no vaginal discharge its been almost 3 montsh since my last Depo..started spotting a bit today"   (Consider location/radiation/quality/duration/timing/severity/associated sxs/prior treatment) Patient is a 30 y.o. female presenting with urinary tract infection. The history is provided by the patient.  Urinary Tract Infection This is a new problem. The current episode started yesterday. The problem occurs constantly. Associated symptoms include abdominal pain. Exacerbated by: Raphael Gibney. The symptoms are relieved by nothing.    Past Medical History  Diagnosis Date  . Trichomonal infection     Past Surgical History  Procedure Date  . Cesarean section     History reviewed. No pertinent family history.  History  Substance Use Topics  . Smoking status: Never Smoker   . Smokeless tobacco: Not on file  . Alcohol Use: No    OB History    Grav Para Term Preterm Abortions TAB SAB Ect Mult Living                  Review of Systems  Constitutional: Negative.   Gastrointestinal: Positive for abdominal pain.  Genitourinary: Positive for dysuria, urgency, frequency and menstrual problem. Negative for flank pain, vaginal discharge and vaginal pain.    Allergies  Review of patient's allergies indicates no known allergies.  Home Medications   Current Outpatient Rx  Name Route Sig Dispense Refill  . IMIPRAMINE HCL 25 MG PO TABS Oral Take 25 mg by mouth daily. Titrate to 50mg  per MD (prescribed by Dr[facility]ina [facility] ([facility]     . METRONIDAZOLE 500 MG PO TABS Oral Take 500 mg by mouth 2 (two) times daily before a meal.      .  DEPO-PROVERA IM  Injection every 3 months       BP 123/86  Pulse 83  Temp(Src) 98.6 F (37 C) (Oral)  Resp 18  SpO2 100%  Physical Exam  Constitutional: She appears well-developed and well-nourished.  Abdominal: There is no tenderness.  Genitourinary: No vaginal discharge found.  Neurological: She is alert.    ED Course  Procedures (including critical care time)  Labs Reviewed  POCT URINALYSIS DIP (DEVICE) - Abnormal; Notable for the following:    Bilirubin Urine SMALL (*)    Ketones, ur TRACE (*)    Protein, ur 30 (*)    Leukocytes, UA TRACE (*) Biochemical Testing Only. Please order routine urinalysis from main lab if confirmatory testing is needed.   All other components within normal limits  POCT URINALYSIS DIPSTICK   No results found.   No diagnosis found.    MDM  New onset urinary symptoms        Jimmie Molly, MD 01/29/11 [id number]  Jimmie Molly, MD 01/29/11 0900  Jimmie Molly, MD 01/29/11 8295  Jimmie Molly, MD 01/29/11 [phone]

## 2011-02-03 ENCOUNTER — Telehealth (HOSPITAL_COMMUNITY): Payer: Self-pay | Admitting: *Deleted

## 2011-02-06 ENCOUNTER — Encounter: Payer: Self-pay | Admitting: Family Medicine

## 2011-02-06 ENCOUNTER — Ambulatory Visit (INDEPENDENT_AMBULATORY_CARE_PROVIDER_SITE_OTHER): Payer: 59 | Admitting: Family Medicine

## 2011-02-06 VITALS — BP 123/74 | HR 64 | Temp 98.0°F | Ht 63.5 in | Wt 130.0 lb

## 2011-02-06 DIAGNOSIS — N760 Acute vaginitis: Secondary | ICD-10-CM

## 2011-02-06 DIAGNOSIS — N898 Other specified noninflammatory disorders of vagina: Secondary | ICD-10-CM | POA: Insufficient documentation

## 2011-02-06 LAB — POCT WET PREP (WET MOUNT)
Clue Cells Wet Prep HPF POC: NEGATIVE
Yeast Wet Prep HPF POC: NEGATIVE

## 2011-02-06 NOTE — Progress Notes (Signed)
  Subjective:    Patient ID: Elizabeth [name], female    DOB: 1981/03/20, 30 y.o.   MRN: [id number]  HPI 30 yo here for same day appt  Woke up this morning, had vaginal odor.  Scant vaginal discharge.  No abd pain, recent abx use, fever, dyspareunia, urinary frequency or dysuria.  Had trichomonas several months ago, was treated.   Review of Systems See HPI    Objective:   Physical Exam  GEN: NAD Pelvic Exam:        External: normal female genitalia without lesions or masses        Vagina: normal without lesions or masses        Cervix: normal without lesions or masses        Adnexa: normal bimanual exam without masses or fullness        Uterus: normal by palpation        Samples for Wet prep, GC/Chlamydia obtained       Assessment & Plan:

## 2011-02-06 NOTE — Assessment & Plan Note (Signed)
Wet prep wnl.  Gc./chl pending.  Likely physiologic odor.

## 2011-02-06 NOTE — Patient Instructions (Signed)
I will give you a call with results Let us know if you dont hear back from [place]

## 2011-02-07 ENCOUNTER — Encounter: Payer: Self-pay | Admitting: Family Medicine

## 2011-06-28 ENCOUNTER — Encounter (HOSPITAL_COMMUNITY): Payer: Self-pay | Admitting: *Deleted

## 2011-06-28 ENCOUNTER — Emergency Department (INDEPENDENT_AMBULATORY_CARE_PROVIDER_SITE_OTHER)
Admission: EM | Admit: 2011-06-28 | Discharge: 2011-06-28 | Disposition: A | Discharge status: Home / Self Care | Payer: 59 | Source: Home / Self Care | Attending: Emergency Medicine | Admitting: Emergency Medicine

## 2011-06-28 DIAGNOSIS — J329 Chronic sinusitis, unspecified: Secondary | ICD-10-CM

## 2011-06-28 MED ORDER — FEXOFENADINE-PSEUDOEPHED ER 60-120 MG PO TB12: 1.0000 | ORAL_TABLET | Freq: Two times a day (BID) | ORAL | Status: AC

## 2011-06-28 MED ORDER — AMOXICILLIN 500 MG PO CAPS: 500.0000 mg | ORAL_CAPSULE | Freq: Three times a day (TID) | ORAL | Status: AC

## 2011-06-28 NOTE — ED Notes (Signed)
Sinus congestion x one week with headache -

## 2011-06-28 NOTE — Discharge Instructions (Signed)
As discussed use Allegra-D for 48 hours along with aggressive hydration and humidifier use if no improvement or sinus congestion or pressure or worsens start with provided antibiotic.   Sinusitis Sinuses are air pockets within the bones of your face. The growth of bacteria within a sinus leads to infection. The infection prevents the sinuses from draining. This infection is called sinusitis. SYMPTOMS  There will be different areas of pain depending on which sinuses have become infected.  The maxillary sinuses often produce pain beneath the eyes.   Frontal sinusitis may cause pain in the middle of the forehead and above the eyes.  Other problems (symptoms) include:  Toothaches.   Colored, pus-like (purulent) drainage from the nose.   Swelling, warmth, and tenderness over the sinus areas may be signs of infection.  TREATMENT  Sinusitis is most often determined by an exam.X-rays may be taken. If x-rays have been taken, make sure you obtain your results or find out how you are to obtain them. Your caregiver may give you medications (antibiotics). These are medications that will help kill the bacteria causing the infection. You may also be given a medication (decongestant) that helps to reduce sinus swelling.  HOME CARE INSTRUCTIONS   Only take over-the-counter or prescription medicines for pain, discomfort, or fever as directed by your caregiver.   Drink extra fluids. Fluids help thin the mucus so your sinuses can drain more easily.   Applying either moist heat or ice packs to the sinus areas may help relieve discomfort.   Use saline nasal sprays to help moisten your sinuses. The sprays can be found at your local drugstore.  SEEK IMMEDIATE MEDICAL CARE IF:  You have a fever.   You have increasing pain, severe headaches, or toothache.   You have nausea, vomiting, or drowsiness.   You develop unusual swelling around the face or trouble seeing.  MAKE SURE YOU:   Understand these  instructions.   Will watch your condition.   Will get help right away if you are not doing well or get worse.  Document Released: 03/10/2005 Document Revised: 02/27/2011 Document Reviewed: 10/07/2006 [facility] Patient Information 2012 [place], [place].

## 2011-06-28 NOTE — ED Provider Notes (Signed)
History     CSN: [id number]  Arrival date & time 06/28/11  1130   First MD Initiated Contact with Patient 06/28/11 1151      Chief Complaint  Patient presents with  . Nasal Congestion    (Consider location/radiation/quality/duration/timing/severity/associated sxs/prior treatment) HPI Comments: Patient presents urgent care complaining of sinus congestion and pressure with postnasal dripping. (Points to both maxillary and frontal regions) patient describes that tripping is making her cough sometimes. No vomiting, no nausea or diarrhea as.  Patient is a 31 y.o. female presenting with sinusitis. The history is provided by the patient.  Sinusitis  This is a new problem. The current episode started more than 1 week ago. The problem has been gradually worsening. The maximum temperature recorded prior to her arrival was 100 to 100.9 F. The pain is at a severity of 7/10. The pain is moderate. The pain has been constant since onset. Associated symptoms include chills, congestion, ear pain, sinus pressure, sore throat, swollen glands and cough. Pertinent negatives include no shortness of breath. She has tried other medications for the symptoms. The treatment provided no relief.    Past Medical History  Diagnosis Date  . Trichomonal infection     Past Surgical History  Procedure Date  . Cesarean section     History reviewed. No pertinent family history.  History  Substance Use Topics  . Smoking status: Never Smoker   . Smokeless tobacco: Not on file  . Alcohol Use: No    OB History    Grav Para Term Preterm Abortions TAB SAB Ect Mult Living                  Review of Systems  Constitutional: Positive for chills and appetite change.  HENT: Positive for ear pain, congestion, sore throat, rhinorrhea, postnasal drip and sinus pressure. Negative for trouble swallowing.   Eyes: Negative for discharge and itching.  Respiratory: Positive for cough. Negative for chest tightness and  shortness of breath.     Allergies  Review of patient's allergies indicates no known allergies.  Home Medications   Current Outpatient Rx  Name Route Sig Dispense Refill  . AMOXICILLIN 500 MG PO CAPS Oral Take 1 capsule (500 mg total) by mouth 3 (three) times daily. 30 capsule 0  . FEXOFENADINE-PSEUDOEPHED ER 60-120 MG PO TB12 Oral Take 1 tablet by mouth 2 (two) times daily. 30 tablet 0  . IMIPRAMINE HCL 25 MG PO TABS Oral Take 25 mg by mouth daily. Titrate to 50mg  per MD (prescribed by Dr[facility] Catalina Lunger ([facility]))     . DEPO-PROVERA IM  Injection every 3 months       BP 117/78  Pulse 79  Temp(Src) 98.5 F (36.9 C) (Oral)  Resp 18  SpO2 97%  Physical Exam  Nursing note and vitals reviewed. Constitutional: She appears well-developed and well-nourished.  Non-toxic appearance. She does not have a sickly appearance. She does not appear ill. No distress.  HENT:  Right Ear: No drainage, swelling or tenderness. Tympanic membrane is injected. Tympanic membrane is not perforated. No middle ear effusion.  Left Ear: No drainage, swelling or tenderness. Tympanic membrane is injected. Tympanic membrane is not perforated.  No middle ear effusion.  Nose: Mucosal edema present.  Mouth/Throat: Uvula is midline and oropharynx is clear and moist. No posterior oropharyngeal erythema.  Eyes: Conjunctivae are normal. No scleral icterus.  Neck: Neck supple.  Cardiovascular:  No murmur heard. Pulmonary/Chest: Effort normal. No respiratory distress. She has no  decreased breath sounds. She has no wheezes. She has no rhonchi. She has no rales. She exhibits no tenderness.  Abdominal: There is no tenderness.  Lymphadenopathy:    She has no cervical adenopathy.  Skin: No rash noted. No erythema.    ED Course  Procedures (including critical care time)  Labs Reviewed - No data to display No results found.   1. Sinusitis       MDM  Patient with sinusitis for 8  days.        Jimmie Molly, MD 06/28/11 [phone]

## 2011-07-19 ENCOUNTER — Encounter (HOSPITAL_COMMUNITY): Payer: Self-pay | Admitting: Emergency Medicine

## 2011-12-31 ENCOUNTER — Ambulatory Visit (INDEPENDENT_AMBULATORY_CARE_PROVIDER_SITE_OTHER): Payer: 59 | Admitting: Emergency Medicine

## 2011-12-31 ENCOUNTER — Encounter: Payer: Self-pay | Admitting: Emergency Medicine

## 2011-12-31 VITALS — BP 125/80 | HR 100 | Ht 63.5 in | Wt 137.7 lb

## 2011-12-31 DIAGNOSIS — N898 Other specified noninflammatory disorders of vagina: Secondary | ICD-10-CM

## 2011-12-31 LAB — POCT WET PREP (WET MOUNT): Clue Cells Wet Prep Whiff POC: NEGATIVE

## 2011-12-31 MED ORDER — FLUCONAZOLE 150 MG PO TABS: 150.0000 mg | ORAL_TABLET | Freq: Once | ORAL | Status: DC

## 2011-12-31 NOTE — Patient Instructions (Addendum)
You have a yeast infection.  I have sent a prescription to the pharmacy.  This is a one time medicine.

## 2011-12-31 NOTE — Assessment & Plan Note (Signed)
No red flags on history or exam for PID.  GC/Chlamydia collected given history of unprotected sex. Wet prep showed few yeast which is consistent with exam.  Will treat with Diflucan 150mg  PO x1.  Follow up in 1 week if no improvement.

## 2011-12-31 NOTE — Progress Notes (Signed)
  Subjective:    Patient ID: Elizabeth [name], female    DOB: [birth year], 31 y.o.   MRN: [id number]  HPI Elizabeth [name] is here for a SDA appt for vaginal discharge.  Reports a watery discharge over the last few days.  Also had some intermittent vaginal itching for last 2 weeks.  No fevers, abdominal pain, urinary symptoms, recent antibiotics.  On depo for birth control, reports excellent compliance.  Did have unprotected sex about 1 month ago.  I have reviewed and updated the following as appropriate: allergies, current medications and problem list SHx: never smoker   Review of Systems See HPI    Objective:   Physical Exam BP 125/80  Pulse 100  Ht 5' 3.5" (1.613 m)  Wt 137 lb 11.2 oz (62.46 kg)  BMI 24.01 kg/m2 Gen: alert, cooperative, NAD Pelvic: normal external genitalia, normal vagina, thick white discharge notes, normal appearing cervix      Assessment & Plan:

## 2012-01-05 ENCOUNTER — Telehealth: Payer: Self-pay | Admitting: Family Medicine

## 2012-01-05 DIAGNOSIS — N898 Other specified noninflammatory disorders of vagina: Secondary | ICD-10-CM

## 2012-01-05 MED ORDER — FLUCONAZOLE 150 MG PO TABS: 150.0000 mg | ORAL_TABLET | Freq: Once | ORAL | Status: DC

## 2012-01-05 NOTE — Telephone Encounter (Signed)
Please call Elizabeth [name] regarding labs from last week.

## 2012-01-05 NOTE — Telephone Encounter (Signed)
Called pt.

## 2012-01-05 NOTE — Telephone Encounter (Signed)
Second dose of diflucan send electronically.

## 2012-01-05 NOTE — Telephone Encounter (Signed)
Informed pt of neg STD results. Some yeast on wet prep. Pt took Diflucan already, but still has some discharge and request for Dr.Booth to call in a second dose of Diflucan (she said, it was discussed at her last visit) Fwd. To Dr.Booth .Arlyss Repress

## 2012-06-30 ENCOUNTER — Emergency Department (INDEPENDENT_AMBULATORY_CARE_PROVIDER_SITE_OTHER)
Admission: EM | Admit: 2012-06-30 | Discharge: 2012-06-30 | Disposition: A | Discharge status: Home / Self Care | Payer: Self-pay | Source: Home / Self Care

## 2012-06-30 ENCOUNTER — Encounter (HOSPITAL_COMMUNITY): Payer: Self-pay

## 2012-06-30 DIAGNOSIS — K5900 Constipation, unspecified: Secondary | ICD-10-CM

## 2012-06-30 MED ORDER — BISACODYL 10 MG RE SUPP: 10.0000 mg | RECTAL | Status: DC | PRN

## 2012-06-30 MED ORDER — SENNA-DOCUSATE SODIUM 8.6-50 MG PO TABS: 1.0000 | ORAL_TABLET | Freq: Two times a day (BID) | ORAL | Status: DC | PRN

## 2012-06-30 NOTE — ED Provider Notes (Signed)
History     CSN: [id number]  Arrival date & time 06/30/12  1136   First MD Initiated Contact with Patient 06/30/12 1202      Chief Complaint  Patient presents with  . Back Pain    (Consider location/radiation/quality/duration/timing/severity/associated sxs/prior Treatment)  HPI 32 year old female with no significant past medical history who presented with complaints of low back pain started a few days prior to this visit. Patient did not have a bowel movement for couple of days and has tried laxatives at home which worked and she had only one bowel movement but her low back pain and abdominal pain continues. Patient reports a mid to upper abdomen pain, 3-4 of 10 in intensity, nonradiating. Her last menstrual period was 06/10/2012. No complaints of nausea or vomiting. She did notice some mucus in her one bowel movement but no blood in it. No diarrhea. No fever or chills. No urinary symptoms.  Past Medical History  Diagnosis Date  . Trichomonal infection     Past Surgical History  Procedure Laterality Date  . Cesarean section     Family medical history significant for HTN, HLD   History  Substance Use Topics  . Smoking status: Never Smoker   . Smokeless tobacco: Not on file  . Alcohol Use: No    OB History   Grav Para Term Preterm Abortions TAB SAB Ect Mult Living                  Review of Systems  Constitutional: Negative for fever, chills, diaphoresis, activity change, appetite change and fatigue.  HENT: Negative for ear pain, nosebleeds, congestion, facial swelling, rhinorrhea, neck pain, neck stiffness and ear discharge.   Eyes: Negative for pain, discharge, redness, itching and visual disturbance.  Respiratory: Negative for cough, choking, chest tightness, shortness of breath, wheezing and stridor.   Cardiovascular: Negative for chest pain, palpitations and leg swelling.  Gastrointestinal: Negative for abdominal distention.  positive for  constipation Genitourinary: Negative for dysuria, urgency, frequency, hematuria, flank pain, decreased urine volume, difficulty urinating and dyspareunia.  Musculoskeletal: Positive for back pain, negative for joint swelling, arthralgias and gait problem.  Neurological: Negative for dizziness, tremors, seizures, syncope, facial asymmetry, speech difficulty, weakness, light-headedness, numbness and headaches.  Hematological: Negative for adenopathy. Does not bruise/bleed easily.  Psychiatric/Behavioral: Negative for hallucinations, behavioral problems, confusion, dysphoric mood, decreased concentration and agitation.    Allergies  Review of patient's allergies indicates no known allergies.  Home Medications   Current Outpatient Rx  Name  Route  Sig  Dispense  Refill  . bisacodyl (DULCOLAX) 10 MG suppository   Rectal   Place 1 suppository (10 mg total) rectally as needed for constipation.   30 suppository   0   . fexofenadine-pseudoephedrine (ALLEGRA-D) 60-120 MG per tablet   Oral   Take 1 tablet by mouth 2 (two) times daily.   30 tablet   0   . fluconazole (DIFLUCAN) 150 MG tablet   Oral   Take 1 tablet (150 mg total) by mouth once.   1 tablet   0   . imipramine (TOFRANIL) 25 MG tablet   Oral   Take 25 mg by mouth daily. Titrate to 50mg  per MD (prescribed by Dr[facility] Catalina Lunger ([facility]))          . MedroxyPROGESTERone Acetate (DEPO-PROVERA IM)      Injection every 3 months          . sennosides-docusate sodium (SENOKOT-S) 8.6-50 MG tablet   Oral  Take 1 tablet by mouth 2 (two) times daily as needed for constipation.   60 tablet   0     BP 114/74  Pulse 89  Temp(Src) 97.7 F (36.5 C) (Oral)  Resp 17  SpO2 100%  Physical Exam  Constitutional: Appears well-developed and well-nourished. No distress.  HENT: Normocephalic. External right and left ear normal. Oropharynx is clear and moist.  Eyes: Conjunctivae and EOM are normal. PERRLA, no scleral  icterus.  Neck: Normal ROM. Neck supple. No JVD. No tracheal deviation. No thyromegaly.  CVS: RRR, S1/S2 +, no murmurs, no gallops, no carotid bruit.  Pulmonary: Effort and breath sounds normal, no stridor, rhonchi, wheezes, rales.  Abdominal: Soft. BS +,  no distension, mild tenderness in upper, mid abdomen, no rebound or guarding.  Musculoskeletal: Normal range of motion. No edema and no tenderness.  Lymphadenopathy: No lymphadenopathy noted, cervical, inguinal. Neuro: Alert. Normal reflexes, muscle tone coordination. No cranial nerve deficit. Skin: Skin is warm and dry. No rash noted. Not diaphoretic. No erythema. No pallor.  Psychiatric: Normal mood and affect. Behavior, judgment, thought content normal.    ED Course  Procedures (including critical care time)  Labs Reviewed - No data to display No results found.   1. Constipation    - Patient tried laxatives at home with minimal relief, she did have one minor bowel movement - As patient still feels low back pain and is still constipated we will prescribe Senokot twice a day as needed and bisacodyl as needed - Patient instructed to call back if he is still constipated after 24-48 hours. Patient was also instructed to use prune juice if the above medications don't work.   MDM  Constipation        Alison Murray, MD 06/30/12 1219

## 2012-06-30 NOTE — ED Notes (Signed)
Patient states has been having mucous in her bowel movements Also has back pain associated with this symptom

## 2012-08-19 ENCOUNTER — Encounter: Payer: Self-pay | Admitting: Family Medicine

## 2012-08-19 ENCOUNTER — Ambulatory Visit: Payer: No Typology Code available for payment source | Attending: Family Medicine | Admitting: Family Medicine

## 2012-08-19 VITALS — BP 115/76 | HR 62 | Temp 99.0°F | Resp 16 | Ht 63.75 in | Wt 146.0 lb

## 2012-08-19 DIAGNOSIS — R109 Unspecified abdominal pain: Secondary | ICD-10-CM

## 2012-08-19 DIAGNOSIS — R1030 Lower abdominal pain, unspecified: Secondary | ICD-10-CM

## 2012-08-19 DIAGNOSIS — K5900 Constipation, unspecified: Secondary | ICD-10-CM

## 2012-08-19 LAB — POCT URINALYSIS DIPSTICK
Leukocytes, UA: NEGATIVE
Nitrite, UA: NEGATIVE
Urobilinogen, UA: 0.2
pH, UA: 7.5

## 2012-08-19 NOTE — Progress Notes (Signed)
Patient states that she has been trying prescribed laxatives but not having any BM.

## 2012-08-19 NOTE — Progress Notes (Signed)
Subjective:     Patient ID: Elizabeth [name], female   DOB: January 21, 1981, 31 y.o.   MRN: [id number]  HPI Pt here with constipation. This has been a problem for her on and off for many years.  Earlier this month she started on senokot and also takes occasional dulcolaxes. These help somewhat. She drinks "some" water, eats "a little fruit and vegetables" and eats a lot of fast food/prepared foods and refined foods. With the constipatoin she gets an achy lower abdomen and low back This is relieved with BM.    Review of Systemsno abd pain, occasional dysuria.      Objective:   Physical Exam  Nursing note and vitals reviewed. Constitutional: She appears well-developed and well-nourished.  Cardiovascular: Normal rate, regular rhythm and normal heart sounds.   Pulmonary/Chest: Effort normal and breath sounds normal.  Abdominal: Soft. Bowel sounds are normal. She exhibits no distension. There is no tenderness. There is no rebound.  Skin: Skin is warm and dry.       Assessment:     Suprapubic pain, unspecified laterality - Plan: Urinalysis Dipstick  Unspecified constipation - Plan: Urinalysis Dipstick, POCT urine pregnancy       Plan:     Will have her use miralax  Also discussed diet - increase water and produce rtc 2 weeks f/u

## 2012-08-19 NOTE — Patient Instructions (Signed)
* Increase water until your are urinating near clear, roughly 64 oz per day * Miralax as we discussed * Increase fruits and veggies to 5-6 servings daily * Avoid processed foods such as white bread and fast food  Constipation, Adult Constipation is when a person has fewer than 3 bowel movements a week; has difficulty having a bowel movement; or has stools that are dry, hard, or larger than normal. As people grow older, constipation is more common. If you try to fix constipation with medicines that make you have a bowel movement (laxatives), the problem may get worse. Long-term laxative use may cause the muscles of the colon to become weak. A low-fiber diet, not taking in enough fluids, and taking certain medicines may make constipation worse. CAUSES   Certain medicines, such as antidepressants, pain medicine, iron supplements, antacids, and water pills.   Certain diseases, such as diabetes, irritable bowel syndrome (IBS), thyroid disease, or depression.   Not drinking enough water.   Not eating enough fiber-rich foods.   Stress or travel.  Lack of physical activity or exercise.  Not going to the restroom when there is the urge to have a bowel movement.  Ignoring the urge to have a bowel movement.  Using laxatives too much. SYMPTOMS   Having fewer than 3 bowel movements a week.   Straining to have a bowel movement.   Having hard, dry, or larger than normal stools.   Feeling full or bloated.   Pain in the lower abdomen.  Not feeling relief after having a bowel movement. DIAGNOSIS  Your caregiver will take a medical history and perform a physical exam. Further testing may be done for severe constipation. Some tests may include:   A barium enema X-ray to examine your rectum, colon, and sometimes, your small intestine.  A sigmoidoscopy to examine your lower colon.  A colonoscopy to examine your entire colon. TREATMENT  Treatment will depend on the severity of your  constipation and what is causing it. Some dietary treatments include drinking more fluids and eating more fiber-rich foods. Lifestyle treatments may include regular exercise. If these diet and lifestyle recommendations do not help, your caregiver may recommend taking over-the-counter laxative medicines to help you have bowel movements. Prescription medicines may be prescribed if over-the-counter medicines do not work.  HOME CARE INSTRUCTIONS   Increase dietary fiber in your diet, such as fruits, vegetables, whole grains, and beans. Limit high-fat and processed sugars in your diet, such as [place] fries, hamburgers, cookies, candies, and soda.   A fiber supplement may be added to your diet if you cannot get enough fiber from foods.   Drink enough fluids to keep your urine clear or pale yellow.   Exercise regularly or as directed by your caregiver.   Go to the restroom when you have the urge to go. Do not hold it.  Only take medicines as directed by your caregiver. Do not take other medicines for constipation without talking to your caregiver first. SEEK IMMEDIATE MEDICAL CARE IF:   You have bright red blood in your stool.   Your constipation lasts for more than 4 days or gets worse.   You have abdominal or rectal pain.   You have thin, pencil-like stools.  You have unexplained weight loss. MAKE SURE YOU:   Understand these instructions.  Will watch your condition.  Will get help right away if you are not doing well or get worse. Document Released: 12/07/2003 Document Revised: 06/02/2011 Document Reviewed: 02/11/2011 [employer] Patient  Information 2014 [employer]  

## 2012-09-03 ENCOUNTER — Ambulatory Visit: Payer: Self-pay

## 2012-09-22 ENCOUNTER — Ambulatory Visit: Payer: No Typology Code available for payment source | Attending: Family Medicine | Admitting: Internal Medicine

## 2012-09-22 VITALS — BP 106/73 | HR 85 | Temp 98.5°F | Resp 16 | Ht 64.0 in | Wt 141.8 lb

## 2012-09-22 DIAGNOSIS — R109 Unspecified abdominal pain: Secondary | ICD-10-CM | POA: Insufficient documentation

## 2012-09-22 DIAGNOSIS — R1032 Left lower quadrant pain: Secondary | ICD-10-CM

## 2012-09-22 MED ORDER — TRAMADOL HCL 50 MG PO TABS: 50.0000 mg | ORAL_TABLET | Freq: Three times a day (TID) | ORAL | Status: DC | PRN

## 2012-09-22 NOTE — Progress Notes (Signed)
Pt comes in today with c/o left abdominal sharp intermit pain radiating down to left leg since 09/10/12. Pt has been on Depo Injections x 10 yrs and received last inject 09/09/12. Light/brown spotting noted but not full menstrual. Denies vag d/c or n/v.

## 2012-09-22 NOTE — Patient Instructions (Addendum)

## 2012-09-22 NOTE — Progress Notes (Signed)
Patient ID: Elizabeth [name], female   DOB: 08/16/1980, 32 y.o.   MRN: [id number]   CC: Followup  HPI: Patient is 32 year old female who presents to clinic with main concern of ongoing lower abdominal cramping that initially started 3 weeks prior to this visit, patient explains she was started on Depakote Provera shot and has noticed some brown spotting. She denies any fevers or chills, no nausea or vomiting, no other abdominal concerns, she explains that cramps occur intermittently, go away on its own, no specific alleviating or aggravating factors, 2/10 in severity when present. She denies similar events in the past. She She has no GYN.  No Known Allergies Past Medical History  Diagnosis Date  . Trichomonal infection    Current Outpatient Prescriptions on File Prior to Visit  Medication Sig Dispense Refill  . bisacodyl (DULCOLAX) 10 MG suppository Place 1 suppository (10 mg total) rectally as needed for constipation.  30 suppository  0  . sennosides-docusate sodium (SENOKOT-S) 8.6-50 MG tablet Take 1 tablet by mouth 2 (two) times daily as needed for constipation.  60 tablet  0   No current facility-administered medications on file prior to visit.   No known family medical history History   Social History  . Marital Status: Single    Spouse Name: N/A    Number of Children: N/A  . Years of Education: N/A   Occupational History  . Not on file.   Social History Main Topics  . Smoking status: Never Smoker   . Smokeless tobacco: Not on file  . Alcohol Use: No  . Drug Use: No  . Sexually Active: Yes    Birth Control/ Protection: Injection   Other Topics Concern  . Not on file   Social History Narrative  . No narrative on file    Review of Systems  Constitutional: Negative for fever, chills, diaphoresis, activity change, appetite change and fatigue.  HENT: Negative for ear pain, nosebleeds, congestion, facial swelling, rhinorrhea, neck pain, neck stiffness and ear discharge.    Eyes: Negative for pain, discharge, redness, itching and visual disturbance.  Respiratory: Negative for cough, choking, chest tightness, shortness of breath, wheezing and stridor.   Cardiovascular: Negative for chest pain, palpitations and leg swelling.  Gastrointestinal: Negative for abdominal distention.  Genitourinary: Negative for dysuria, urgency, frequency, hematuria, flank pain, decreased urine volume, difficulty urinating and dyspareunia.  Musculoskeletal: Negative for back pain, joint swelling, arthralgias and gait problem.  Neurological: Negative for dizziness, tremors, seizures, syncope, facial asymmetry, speech difficulty, weakness, light-headedness, numbness and headaches.  Hematological: Negative for adenopathy. Does not bruise/bleed easily.  Psychiatric/Behavioral: Negative for hallucinations, behavioral problems, confusion, dysphoric mood, decreased concentration and agitation.    Objective:   Filed Vitals:   09/22/12 1056  BP: 106/73  Pulse: 85  Temp: 98.5 F (36.9 C)  Resp: 16    Physical Exam  Constitutional: Appears well-developed and well-nourished. No distress.  CVS: RRR, S1/S2 +, no murmurs, no gallops, no carotid bruit.  Pulmonary: Effort and breath sounds normal, no stridor, rhonchi, wheezes, rales.  Abdominal: Soft. BS +,  no distension, tenderness, rebound or guarding.  Musculoskeletal: Normal range of motion. No edema and no tenderness.   genitourinary exam: Pap smear performed, brown spots around cervix noted, no bright red blood noted, no tenderness with examination, no other lesions noted around cervical area  Lab Results  Component Value Date   WBC 4.4* 09/14/2007   HGB 13.4 09/14/2007   HCT 38.4 09/14/2007   MCV 83.9 09/14/2007  PLT 188 09/14/2007   Lab Results  Component Value Date   CREATININE 0.8 09/14/2007   BUN 15 09/14/2007   NA 142 09/14/2007   K 3.4* 09/14/2007   CL 106 09/14/2007   CO2 25 09/14/2007    No results found for this  basename: HGBA1C   Lipid Panel  No results found for this basename: chol, trig, hdl, cholhdl, vldl, ldlcalc       Assessment and plan:   Patient Active Problem List   Diagnosis Date Noted  . Vaginal discharge - brown spots on cervical examination noted, no active bleeding noted, GYN referral will be provided, patient and advise to go to emergency department if her symptoms get worse and she still has not seen her GYN. PAP smear done and will need to follow up on results   02/06/2011

## 2012-09-29 ENCOUNTER — Telehealth: Payer: Self-pay | Admitting: Family Medicine

## 2012-09-29 NOTE — Telephone Encounter (Addendum)
Pt calling about results for pelvic exam done on 09/22/12.  Pt looked on mychart, results not up yet. Pt goes to work at 2pm, if no answer please leave results on voicemail.

## 2012-10-01 ENCOUNTER — Telehealth: Payer: Self-pay

## 2012-10-01 NOTE — Telephone Encounter (Signed)
Per patient request results left on machine [place] normal Call us if she has any questions

## 2012-10-15 ENCOUNTER — Inpatient Hospital Stay (HOSPITAL_COMMUNITY)
Admission: AD | Admit: 2012-10-15 | Discharge: 2012-10-15 | Disposition: A | Discharge status: Home / Self Care | Payer: No Typology Code available for payment source | Source: Ambulatory Visit | Attending: Obstetrics and Gynecology | Admitting: Obstetrics and Gynecology

## 2012-10-15 ENCOUNTER — Encounter (HOSPITAL_COMMUNITY): Payer: Self-pay

## 2012-10-15 ENCOUNTER — Inpatient Hospital Stay (HOSPITAL_COMMUNITY): Payer: No Typology Code available for payment source

## 2012-10-15 ENCOUNTER — Ambulatory Visit: Payer: No Typology Code available for payment source | Attending: Family Medicine | Admitting: Family Medicine

## 2012-10-15 ENCOUNTER — Other Ambulatory Visit (HOSPITAL_COMMUNITY)
Admission: RE | Admit: 2012-10-15 | Discharge: 2012-10-15 | Disposition: A | Discharge status: Home / Self Care | Payer: No Typology Code available for payment source | Source: Ambulatory Visit | Attending: Family Medicine | Admitting: Family Medicine

## 2012-10-15 VITALS — BP 145/72 | HR 16 | Temp 99.0°F | Resp 16 | Ht 64.0 in | Wt 140.0 lb

## 2012-10-15 DIAGNOSIS — N949 Unspecified condition associated with female genital organs and menstrual cycle: Secondary | ICD-10-CM | POA: Insufficient documentation

## 2012-10-15 DIAGNOSIS — R1032 Left lower quadrant pain: Secondary | ICD-10-CM | POA: Insufficient documentation

## 2012-10-15 DIAGNOSIS — N854 Malposition of uterus: Secondary | ICD-10-CM | POA: Insufficient documentation

## 2012-10-15 DIAGNOSIS — N760 Acute vaginitis: Secondary | ICD-10-CM | POA: Insufficient documentation

## 2012-10-15 DIAGNOSIS — N83209 Unspecified ovarian cyst, unspecified side: Secondary | ICD-10-CM

## 2012-10-15 DIAGNOSIS — R300 Dysuria: Secondary | ICD-10-CM

## 2012-10-15 DIAGNOSIS — Z113 Encounter for screening for infections with a predominantly sexual mode of transmission: Secondary | ICD-10-CM | POA: Insufficient documentation

## 2012-10-15 DIAGNOSIS — Z01419 Encounter for gynecological examination (general) (routine) without abnormal findings: Secondary | ICD-10-CM | POA: Insufficient documentation

## 2012-10-15 DIAGNOSIS — N898 Other specified noninflammatory disorders of vagina: Secondary | ICD-10-CM | POA: Insufficient documentation

## 2012-10-15 HISTORY — DX: Personal history of other diseases of the digestive system: Z87.19

## 2012-10-15 LAB — POCT URINALYSIS DIPSTICK
Glucose, UA: NEGATIVE
Protein, UA: NEGATIVE
Spec Grav, UA: 1.025
Urobilinogen, UA: 0.2
pH, UA: 5.5

## 2012-10-15 NOTE — Patient Instructions (Addendum)
HPV Test The HPV (human papillomavirus) test is used to screen for high-risk types with HPV infection. HPV is a group of about 100 related viruses, of which 40 types are genital viruses. Most HPV viruses cause infections that usually resolve without treatment within 2 years. Some HPV infections can cause skin and genital warts (condylomata). HPV types 16, 18, 31 and 45 are considered high-risk types of HPV. High-risk types of HPV do not usually cause visible warts, but if untreated, may lead to cancers of the outlet of the womb (cervix) or anus. An HPV test identifies the DNA (genetic) strands of the HPV infection. Because the test identifies the DNA strands, the test is also referred to as the HPV DNA test. Although HPV is found in both males and females, the HPV test is only used to screen for cervical cancer in females. This test is recommended for females:  With an abnormal Pap test.  After treatment of an abnormal Pap test.  Aged 84 and older.  After treatment of a high-risk HPV infection. The HPV test may be done at the same time as a Pap test in females over the age of 1. Both the HPV and Pap test require a sample of cells from the cervix. PREPARATION FOR TEST  You may be asked to avoid douching, tampons, or vaginal medicines for 48 hours before the HPV test. You will be asked to urinate before the test. For the HPV test, you will need to lie on an exam table with your feet in stirrups. A spatula will be inserted into the vagina. The spatula will be used to swab the cervix for a cell and mucus sample. The sample will be further evaluated in a lab under a microscope. NORMAL FINDINGS  Normal: High-risk HPV is not found.  Ranges for normal findings may vary among different laboratories and hospitals. You should always check with your doctor after having lab work or other tests done to discuss the meaning of your test results and whether your values are considered within normal limits. MEANING  OF TEST An abnormal HPV test means that high-risk HPV is found. Your caregiver may recommend further testing. Your caregiver will go over the test results with you. He or she will and discuss the importance and meaning of your results, as well as treatment options and the need for additional tests, if necessary. OBTAINING THE RESULTS  It is your responsibility to obtain your test results. Ask the lab or department performing the test when and how you will get your results. Document Released: 04/04/2004 Document Revised: 06/02/2011 Document Reviewed: 12/18/2004 [facility] Patient Information 2014 [place], [place].  Pelvic Pain Pelvic pain is pain below the belly button and located between your hips. Acute pain may last a few hours or days. Chronic pelvic pain may last weeks and months. The cause may be different for different types of pain. The pain may be dull or sharp, mild or severe and can interfere with your daily activities. Write down and tell your caregiver:   Exactly where the pain is located.  If it comes and goes or is there all the time.  When it happens (with sex, urination, bowel movement, etc.)  If the pain is related to your menstrual period or stress. Your caregiver will take a full history and do a complete physical exam and Pap test. CAUSES   Painful menstrual periods (dysmenorrhea).  Normal ovulation (Mittelschmertz) that occurs in the middle of the menstrual cycle every month.  The  pelvic organs get engorged with blood just before the menstrual period (pelvic congestive syndrome).  Scar tissue from an infection or past surgery (pelvic adhesions).  Cancer of the female pelvic organs. When there is pain with cancer, it has been there for a long time.  The lining of the uterus (endometrium) abnormally grows in places like the pelvis and on the pelvic organs (endometriosis).  A form of endometriosis with the lining of the uterus present inside of the muscle tissue of the  uterus (adenomyosis).  Fibroid tumor (noncancerous) in the uterus.  Bladder problems such as infection, bladder spasms of the muscle tissue of the bladder.  Intestinal problems (irritable bowel syndrome, colitis, an ulcer or gastrointestinal infection).  Polyps of the cervix or uterus.  Pregnancy in the tube (ectopic pregnancy).  The opening of the cervix is too small for the menstrual blood to flow through it (cervical stenosis).  Physical or sexual abuse (past or present).  Musculo-skeletal problems from poor posture, problems with the vertebrae of the lower back or the uterine pelvic muscles falling (prolapse).  Psychological problems such as depression or stress.  IUD (intrauterine device) in the uterus. DIAGNOSIS  Tests to make a diagnosis depends on the type, location, severity and what causes the pain to occur. Tests that may be needed include:  Blood tests.  Urine tests  Ultrasound.  X-rays.  CT Scan.  MRI.  Laparoscopy.  Major surgery. TREATMENT  Treatment will depend on the cause of the pain, which includes:  Prescription or over-the-counter pain medication.  Antibiotics.  Birth control pills.  Hormone treatment.  Nerve blocking injections.  Physical therapy.  Antidepressants.  Counseling with a psychiatrist or psychologist.  Minor or major surgery. HOME CARE INSTRUCTIONS   Only take over-the-counter or prescription medicines for pain, discomfort or fever as directed by your caregiver.  Follow your caregiver's advice to treat your pain.  Rest.  Avoid sexual intercourse if it causes the pain.  Apply warm or cold compresses (which ever works best) to the pain area.  Do relaxation exercises such as yoga or meditation.  Try acupuncture.  Avoid stressful situations.  Try group therapy.  If the pain is because of a stomach/intestinal upset, drink clear liquids, eat a bland light food diet until the symptoms go away. SEEK MEDICAL CARE  IF:   You need stronger prescription pain medication.  You develop pain with sexual intercourse.  You have pain with urination.  You develop a temperature of 102 F (38.9 C) with the pain.  You are still in pain after 4 hours of taking prescription medication for the pain.  You need depression medication.  Your IUD is causing pain and you want it removed. SEEK IMMEDIATE MEDICAL CARE IF:  You develop very severe pain or tenderness.  You faint, have chills, severe weakness or dehydration.  You develop heavy vaginal bleeding or passing solid tissue.  You develop a temperature of 102 F (38.9 C) with the pain.  You have blood in the urine.  You are being physically or sexually abused.  You have uncontrolled vomiting and diarrhea.  You are depressed and afraid of harming yourself or someone else. Document Released: 04/17/2004 Document Revised: 06/02/2011 Document Reviewed: 01/13/2008 [facility] Patient Information 2013 [place], [place].

## 2012-10-15 NOTE — Progress Notes (Signed)
Patient complains of abdominal pain and dysuria with pressure. Dysuria started 1 week ago. Abdominal pain start in June when patient stated she was having spotting; states spotting stopped around August 8th. Patient was supposed to be scheduled for a transverse [place], but had not been given one. Patient states she called and got an appointment for on on August 18th.

## 2012-10-15 NOTE — MAU Provider Note (Signed)
History     CSN: [id number]  Arrival date and time: 10/15/12 1827   First Provider Initiated Contact with Patient 10/15/12 1957      Chief Complaint  Patient presents with  . Abdominal Pain  . Vaginal Discharge   Abdominal Pain  Vaginal Discharge The patient's primary symptoms include a vaginal discharge. Associated symptoms include abdominal pain.    Elizabeth [name] is a 32 y.o. G1P1001 who is here today with brown spotting and LLQ pain. She states that she has had the pain for a month. She saw her PCP today and is scheduled for outpatient ultrasound on 11/08/12, but does not want to wait that long. Her PCP instructed her to come to the ER if her pain worsened. She states that the pain has not worsened, but has stayed constant. She is concerned, and does not want to wait until 11/08/12.   Past Medical History  Diagnosis Date  . Trichomonal infection   . History of IBS     Past Surgical History  Procedure Laterality Date  . Cesarean section      No family history on file.  History  Substance Use Topics  . Smoking status: Never Smoker   . Smokeless tobacco: Not on file  . Alcohol Use: No    Allergies: No Known Allergies  No prescriptions prior to admission    Review of Systems  Gastrointestinal: Positive for abdominal pain.  Genitourinary: Positive for vaginal discharge.   Physical Exam   Blood pressure 121/71, pulse 18, temperature 98.5 F (36.9 C), height 5\' 4"  (1.626 m), weight 63.685 kg (140 lb 6.4 oz), last menstrual period 09/09/2012, SpO2 100.00%.  Physical Exam  Nursing note and vitals reviewed. Constitutional: She is oriented to person, place, and time. She appears well-developed and well-nourished. No distress.  Cardiovascular: Normal rate.   Respiratory: Effort normal.  GI: Soft. There is no tenderness.  Genitourinary:  Pelvic deferred: see note from PCP today  Neurological: She is alert and oriented to person, place, and time.  Skin: Skin  is warm and dry.  Psychiatric: She has a normal mood and affect.    MAU Course  Procedures  Results for orders placed in visit on 10/15/12 (from the past 24 hour(s))  POCT URINALYSIS DIPSTICK     Status: None   Collection Time    10/15/12  3:59 PM      Result Value Range   Color, UA Yellow     Clarity, UA Clear     Glucose, UA Negative     Bilirubin, UA Negative     Ketones, UA Negative     Spec Grav, UA 1.025     Blood, UA Small     pH, UA 5.5     Protein, UA Negative     Urobilinogen, UA 0.2     Nitrite, UA Negative     Leukocytes, UA Negative    POCT URINE PREGNANCY     Status: None   Collection Time    10/15/12  4:16 PM      Result Value Range   Preg Test, Ur Negative      US Transvaginal Non-ob  10/15/2012   *RADIOLOGY REPORT*  Clinical Data: Pelvic pain, left greater than right.  History of C- section and Depo-Provera therapy.  TRANSABDOMINAL AND TRANSVAGINAL ULTRASOUND OF PELVIS Technique:  Both transabdominal and transvaginal ultrasound examinations of the pelvis were performed. Transabdominal technique was performed for global imaging of the pelvis including uterus, ovaries, adnexal  regions, and pelvic cul-de-sac.  It was necessary to proceed with endovaginal exam following the transabdominal exam to visualize the endometrium and ovaries to better advantage.  Comparison:  Pelvic ultrasound 09/24/2010.  Findings:  Uterus: The uterus is retroflexed, measuring 10.1 x 4.4 x 6.9 cm. The myometrium is homogeneous.  Endometrium: There is trace fluid in the endometrial cavity.  The endometrium measures 3.7 mm in thickness.  Right ovary:  Measures 5.1 x 3.2 x 3.7 cm and contains a small simple cyst measuring 3.3 mm maximally.  Left ovary: Normal in appearance, measuring 3.0 x 1.5 x 2.1 cm.  Other findings: No free fluid  IMPRESSION: Small simple right ovarian cyst requires no specific follow-up.  No acute findings.   Original Report Authenticated By: Carey Bullocks, M.D.   US Pelvis  Complete  10/15/2012   *RADIOLOGY REPORT*  Clinical Data: Pelvic pain, left greater than right.  History of C- section and Depo-Provera therapy.  TRANSABDOMINAL AND TRANSVAGINAL ULTRASOUND OF PELVIS Technique:  Both transabdominal and transvaginal ultrasound examinations of the pelvis were performed. Transabdominal technique was performed for global imaging of the pelvis including uterus, ovaries, adnexal regions, and pelvic cul-de-sac.  It was necessary to proceed with endovaginal exam following the transabdominal exam to visualize the endometrium and ovaries to better advantage.  Comparison:  Pelvic ultrasound 09/24/2010.  Findings:  Uterus: The uterus is retroflexed, measuring 10.1 x 4.4 x 6.9 cm. The myometrium is homogeneous.  Endometrium: There is trace fluid in the endometrial cavity.  The endometrium measures 3.7 mm in thickness.  Right ovary:  Measures 5.1 x 3.2 x 3.7 cm and contains a small simple cyst measuring 3.3 mm maximally.  Left ovary: Normal in appearance, measuring 3.0 x 1.5 x 2.1 cm.  Other findings: No free fluid  IMPRESSION: Small simple right ovarian cyst requires no specific follow-up.  No acute findings.   Original Report Authenticated By: Carey Bullocks, M.D.     Assessment and Plan   1. Other and unspecified ovarian cyst    No FU needed based on [place] report FU with you PCP as planned Return to MAU as needed   Tawnya Crook 10/15/2012, 8:02 PM

## 2012-10-15 NOTE — MAU Note (Signed)
Was seen today at [facility]. Was on Depo for 10 yrs. Last shot 10/13. No period while on shot or since last shot. On June 18th, had some light brown spotting for 3 wks. [facility] to [facility] on [facility] due to abd pain, esp LLQ. Have u/s scheduled for 8/18. Saw doctor today and wanted me to have u/s sooner. I have used Tramadol which helped alittle. On 7/8 the spotting and pain stopped but now is back. Today was told was positive for HPV from tests done on 7/2.

## 2012-10-15 NOTE — MAU Note (Signed)
See triage note. Patient is still having abdominal pain and was recommended to come to MAU for an ultrasound after her visit earlier today. She states that pelvic exam was done and std screen today.

## 2012-10-15 NOTE — Progress Notes (Signed)
Patient ID: Elizabeth [name], female   DOB: Sep 25, 1980, 32 y.o.   MRN: [id number]  CC: vaginal discharge  HPI: Pt is reporting that she is having a vaginal discharge brownish for the past week.   Pt says that she has had pressure in her bladder for the past couple of days.   Pt is scheduled to have a vaginal ultrasound on the 8/18.  Pt says that she does not believe she could be pregnant.  Pt is having some spotting since stopping depoprovera.  Pt reports that she is having LLQ abdominal pain.  Pt reports that she is having some brownish vaginal discharge.    No Known Allergies Past Medical History  Diagnosis Date  . Trichomonal infection    Current Outpatient Prescriptions on File Prior to Visit  Medication Sig Dispense Refill  . bisacodyl (DULCOLAX) 10 MG suppository Place 1 suppository (10 mg total) rectally as needed for constipation.  30 suppository  0  . sennosides-docusate sodium (SENOKOT-S) 8.6-50 MG tablet Take 1 tablet by mouth 2 (two) times daily as needed for constipation.  60 tablet  0  . traMADol (ULTRAM) 50 MG tablet Take 1 tablet (50 mg total) by mouth every 8 (eight) hours as needed for pain.  65 tablet  0   No current facility-administered medications on file prior to visit.   History reviewed. No pertinent family history. History   Social History  . Marital Status: Single    Spouse Name: N/A    Number of Children: N/A  . Years of Education: N/A   Occupational History  . Not on file.   Social History Main Topics  . Smoking status: Never Smoker   . Smokeless tobacco: Not on file  . Alcohol Use: No  . Drug Use: No  . Sexually Active: Yes    Birth Control/ Protection: Injection   Other Topics Concern  . Not on file   Social History Narrative  . No narrative on file    Review of Systems  Constitutional: Negative for fever, chills, diaphoresis, activity change, appetite change and fatigue.  HENT: Negative for ear pain, nosebleeds, congestion, facial  swelling, rhinorrhea, neck pain, neck stiffness and ear discharge.   Eyes: Negative for pain, discharge, redness, itching and visual disturbance.  Respiratory: Negative for cough, choking, chest tightness, shortness of breath, wheezing and stridor.   Cardiovascular: Negative for chest pain, palpitations and leg swelling.  Gastrointestinal: Negative for abdominal distention.  Genitourinary: Pelvic pain and dysuria.  Musculoskeletal: Negative for back pain, joint swelling, arthralgias and gait problem.  Neurological: Negative for dizziness, tremors, seizures, syncope, facial asymmetry, speech difficulty, weakness, light-headedness, numbness and headaches.  Hematological: Negative for adenopathy. Does not bruise/bleed easily.  Psychiatric/Behavioral: Negative for hallucinations, behavioral problems, confusion, dysphoric mood, decreased concentration and agitation.    Objective:   Filed Vitals:   10/15/12 1540  BP: 145/72  Pulse: 16  Temp: 99 F (37.2 C)  Resp: 16    Physical Exam  Constitutional: Appears well-developed and well-nourished. No distress.  HENT: Normocephalic. External right and left ear normal. Oropharynx is clear and moist.  Eyes: Conjunctivae and EOM are normal. PERRLA, no scleral icterus.  Neck: Normal ROM. Neck supple. No JVD. No tracheal deviation. No thyromegaly.  CVS: RRR, S1/S2 +, no murmurs, no gallops, no carotid bruit.  Pulmonary: Effort and breath sounds normal, no stridor, rhonchi, wheezes, rales.  Abdominal: Soft. BS +,  no distension, LLQ TTP, no rebound or guarding.  GU: brownish vaginal discharge seen  coming from cervical os. Musculoskeletal: Normal range of motion. No edema and no tenderness.  Lymphadenopathy: No lymphadenopathy noted, cervical, inguinal. Neuro: Alert. Normal reflexes, muscle tone coordination. No cranial nerve deficit. Skin: Skin is warm and dry. No rash noted. Not diaphoretic. No erythema. No pallor.  Psychiatric: Normal mood and  affect. Behavior, judgment, thought content normal.   Lab Results  Component Value Date   WBC 4.4* 09/14/2007   HGB 13.4 09/14/2007   HCT 38.4 09/14/2007   MCV 83.9 09/14/2007   PLT 188 09/14/2007   Lab Results  Component Value Date   CREATININE 0.8 09/14/2007   BUN 15 09/14/2007   NA 142 09/14/2007   K 3.4* 09/14/2007   CL 106 09/14/2007   CO2 25 09/14/2007    No results found for this basename: HGBA1C   Lipid Panel  No results found for this basename: chol, trig, hdl, cholhdl, vldl, ldlcalc     Assessment and plan:   Patient Active Problem List   Diagnosis Date Noted  . LLQ abdominal pain 10/15/2012  . Vaginal discharge 02/06/2011  . HEADACHE 05/02/2010  . INSOMNIA 06/04/2009   Pelvic pain and vaginal discharge Pt is scheduled for vaginal ultrasound already   Pt tested positive for high risk HPV with pap and explained to patient today  Pt received new cultures today for GC/CHl, trichomonas, yeast, BV  Follow culture results  Pt was referred to gynecology   rTC in 2 months  The patient was given clear instructions to go to ER or return to medical center if symptoms don't improve, worsen or new problems develop.  The patient verbalized understanding.  The patient was told to call to get any lab results if not heard anything in the next week.    Rodney Langton, MD, CDE, FAAFP [facility] [place], [place]

## 2012-10-17 LAB — URINE CULTURE: Colony Count: NO GROWTH

## 2012-10-17 NOTE — MAU Provider Note (Signed)
Attestation of Attending Supervision of Advanced Practitioner: Evaluation and management procedures were performed by the PA/NP/CNM/OB Fellow under my supervision/collaboration. Chart reviewed and agree with management and plan.  Garold Sheeler V 10/17/2012 9:43 PM   

## 2012-10-20 ENCOUNTER — Telehealth: Payer: Self-pay | Admitting: *Deleted

## 2012-10-20 NOTE — Telephone Encounter (Signed)
10/20/12 Spoke with patient made aware that vaginal yeast test came back negative. P.Shaely Gadberry,RN BSN [facility]

## 2012-10-20 NOTE — Telephone Encounter (Signed)
10/20/12 Patient not available message left vie telephone PAP test was normal and STD came  Back negative. P.Audia Amick,RN BSN [facility]

## 2012-10-20 NOTE — Progress Notes (Signed)
Quick Note:  Please inform patient that PAP tests came back normal and STD tests came back negative.   Rodney Langton, MD, CDE, FAAFP [facility] [place], [place]   ______

## 2012-10-20 NOTE — Progress Notes (Signed)
Quick Note:  Please inform patient that vaginal yeast test came back negative.   Rodney Langton, MD, CDE, FAAFP [facility] [place], [place]   ______

## 2012-10-21 ENCOUNTER — Telehealth: Payer: Self-pay

## 2012-10-21 NOTE — Telephone Encounter (Signed)
Patient stopped in today to clarify her results Per DR Johnson-patient did test positive for high risk HPV but Negative for STD Referred to womens clinic  At [facility] Has an appointment august 14, @ 8:30 AM Patient is aware of her appointment

## 2012-11-04 ENCOUNTER — Other Ambulatory Visit (HOSPITAL_COMMUNITY)
Admission: RE | Admit: 2012-11-04 | Discharge: 2012-11-04 | Disposition: A | Discharge status: Home / Self Care | Payer: No Typology Code available for payment source | Source: Ambulatory Visit | Attending: Family Medicine | Admitting: Family Medicine

## 2012-11-04 ENCOUNTER — Ambulatory Visit (INDEPENDENT_AMBULATORY_CARE_PROVIDER_SITE_OTHER): Payer: No Typology Code available for payment source | Admitting: Family Medicine

## 2012-11-04 VITALS — BP 112/59 | HR 99 | Wt 137.2 lb

## 2012-11-04 DIAGNOSIS — R87810 Cervical high risk human papillomavirus (HPV) DNA test positive: Secondary | ICD-10-CM | POA: Insufficient documentation

## 2012-11-04 DIAGNOSIS — IMO0002 Reserved for concepts with insufficient information to code with codable children: Secondary | ICD-10-CM

## 2012-11-04 DIAGNOSIS — R6889 Other general symptoms and signs: Secondary | ICD-10-CM

## 2012-11-04 DIAGNOSIS — Z1151 Encounter for screening for human papillomavirus (HPV): Secondary | ICD-10-CM | POA: Insufficient documentation

## 2012-11-04 DIAGNOSIS — Z01419 Encounter for gynecological examination (general) (routine) without abnormal findings: Secondary | ICD-10-CM | POA: Insufficient documentation

## 2012-11-04 NOTE — Progress Notes (Signed)
Patient ID: Elizabeth [name], female   DOB: 09-28-1980, 32 y.o.   MRN: [id number]  Chief Complaint  Patient presents with  . Abnormal Pap Smear    repeat pap    HPI Elizabeth [name] WARNING is a 32 y.o. female.  Here for repeat PAP per her PCP due to hx of abnormal PAP done July 2nd positive for high grade HPV but normal cytology. PAP was repeated July 25th with normal cytology but HPV was not checked. She denies any concern today other than being worried about her result.    Indications: Pap smear on July 2014 showed: Normal Cytology with positive high grade HPV. Previous colposcopy: none   Past Medical History  Diagnosis Date  . Trichomonal infection   . History of IBS     Past Surgical History  Procedure Laterality Date  . Cesarean section      History reviewed. No pertinent family history.  Social History History  Substance Use Topics  . Smoking status: Never Smoker   . Smokeless tobacco: Not on file  . Alcohol Use: No    No Known Allergies  No current outpatient prescriptions on file.   No current facility-administered medications for this visit.    Review of Systems Review of Systems  Respiratory: Negative.   Cardiovascular: Negative.   Genitourinary: Negative.   All other systems reviewed and are negative.    Blood pressure 112/59, pulse 99, weight 137 lb 3.2 oz (62.234 kg).  Physical Exam Physical Exam  Nursing note and vitals reviewed. Constitutional: She appears well-developed. No distress.  Cardiovascular: Normal rate, regular rhythm, normal heart sounds and intact distal pulses.   No murmur heard. Pulmonary/Chest: Effort normal and breath sounds normal. No respiratory distress. She has no wheezes.  Abdominal: Soft. Bowel sounds are normal. She exhibits no distension and no mass. There is no tenderness.  Genitourinary: Vagina normal and uterus normal. Pelvic exam was performed with patient supine. There is no rash, tenderness, lesion or injury on the  right labia. There is no rash, tenderness, lesion or injury on the left labia. Cervix exhibits no motion tenderness, no discharge and no friability.    Data Reviewed 11/04/12  Assessment and Plan Abnormal PAP PAP test with HPV testing repeated today as per referral. Colposcopy done as well with no visible aceto-white lesion. Will call patient with PAP cytology and HPV report.    Procedure Details  The risks and benefits of the procedure and verbal informed consent obtained.  Speculum placed in vagina and excellent visualization of cervix achieved, cervix swabbed x 3 with acetic acid solution and then lugol's iodine. Colposcopy adequate (entire squamocolumnar junctions seen in entirety) ? yes  Acetowhite lesions? none  Punctation? None  Mosaicism? None  Abnormal vasculature? None  Biopsies? None necessary  ECC? None  Complications? None   Specimens: PAP specimen/swab was obtain, no cervical biopsy needed at this time.  Complications: none.         Elizabeth [name] 11/04/2012, 8:50 AM

## 2012-11-04 NOTE — Patient Instructions (Signed)
Colposcopy  Care After  Colposcopy is a procedure in which a special tool is used to magnify the surface of the cervix. A tissue sample (biopsy) may also be taken. This sample will be looked at for cervical cancer or other problems. After the test:  · You may have some cramping.  · Lie down for a few minutes if you feel lightheaded.  ·  You may have some bleeding which should stop in a few days.  HOME CARE  · Do not have sex or use tampons for 2 to 3 days or as told.  · Only take medicine as told by your doctor.  · Continue to take your birth control pills as usual.  Finding out the results of your test  Ask when your test results will be ready. Make sure you get your test results.  GET HELP RIGHT AWAY IF:  · You are bleeding a lot or are passing blood clots.  · You develop a fever of 102° F (38.9° C) or higher.  · You have abnormal vaginal discharge.  · You have cramps that do not go away with medicine.  · You feel lightheaded, dizzy, or pass out (faint).  MAKE SURE YOU:   · Understand these instructions.  · Will watch your condition.  · Will get help right away if you are not doing well or get worse.  Document Released: 08/27/2007 Document Revised: 06/02/2011 Document Reviewed: 08/27/2007  ExitCare® Patient Information ©2014 [employer].

## 2012-11-08 ENCOUNTER — Encounter: Payer: No Typology Code available for payment source | Admitting: Obstetrics & Gynecology

## 2012-11-09 ENCOUNTER — Telehealth: Payer: Self-pay | Admitting: Family Medicine

## 2012-11-09 NOTE — Telephone Encounter (Signed)
Result discussed with patient,normal pap cytology with positive high risk HPV,advised to follow up at the women's clinic next Thursday to repeat colposcopy otherwise she would need repeat PAP in 1 yr. She verbalized understanding and will call to schedule follow up appointment.

## 2012-11-18 ENCOUNTER — Encounter: Payer: Self-pay | Admitting: Family Medicine

## 2012-11-18 ENCOUNTER — Ambulatory Visit (INDEPENDENT_AMBULATORY_CARE_PROVIDER_SITE_OTHER): Payer: No Typology Code available for payment source | Admitting: Family Medicine

## 2012-11-18 VITALS — BP 104/74 | HR 98 | Temp 98.9°F | Ht 64.0 in | Wt 136.0 lb

## 2012-11-18 DIAGNOSIS — N898 Other specified noninflammatory disorders of vagina: Secondary | ICD-10-CM

## 2012-11-18 DIAGNOSIS — B977 Papillomavirus as the cause of diseases classified elsewhere: Secondary | ICD-10-CM

## 2012-11-18 LAB — POCT WET PREP (WET MOUNT)

## 2012-11-18 MED ORDER — FLUCONAZOLE 100 MG PO TABS: ORAL_TABLET | ORAL | Status: DC

## 2012-11-18 NOTE — Progress Notes (Signed)
Patient ID: [name], female   DOB: 04-29-1980, 32 y.o.   MRN: [id number] Here for further eval and management of abnormal pap. 10/2012 NEG pap + Hi Risk HPV Pap 7/25/ 2014 Neg pap no HPV testing done Pap 09/22/2012 Neg with + Hi Risk HPV Discussed options (has had 2 repeat paps, one repeat HPV) so she could have gone direct;y to repeat pap one year. Patient given informed consent, signed copy in the chart.  Placed in lithotomy position. Cervix viewed with speculum and colposcope after application of acetic acid.   Colposcopy adequate (entire squamocolumnar junctions seen  in entirety) ?  no Acetowhite lesions?no Punctation?no Mosaicism?  no Abnormal vasculature?  no Biopsies?no ECC?no Complications? no  COMMENTS: Patient was given post procedure instructions. Normal PAp w + hi risk HPV---colpo clinically normal. I rec repet pap in 2 years for her.

## 2012-12-10 ENCOUNTER — Encounter: Payer: Self-pay | Admitting: Internal Medicine

## 2012-12-10 ENCOUNTER — Ambulatory Visit: Payer: No Typology Code available for payment source | Attending: Internal Medicine | Admitting: Internal Medicine

## 2012-12-10 VITALS — BP 109/71 | HR 78 | Temp 99.2°F | Resp 16 | Ht 64.0 in | Wt 138.0 lb

## 2012-12-10 DIAGNOSIS — Z309 Encounter for contraceptive management, unspecified: Secondary | ICD-10-CM | POA: Insufficient documentation

## 2012-12-10 DIAGNOSIS — Z3042 Encounter for surveillance of injectable contraceptive: Secondary | ICD-10-CM | POA: Insufficient documentation

## 2012-12-10 DIAGNOSIS — Z09 Encounter for follow-up examination after completed treatment for conditions other than malignant neoplasm: Secondary | ICD-10-CM | POA: Insufficient documentation

## 2012-12-10 DIAGNOSIS — K589 Irritable bowel syndrome without diarrhea: Secondary | ICD-10-CM | POA: Insufficient documentation

## 2012-12-10 DIAGNOSIS — Z3049 Encounter for surveillance of other contraceptives: Secondary | ICD-10-CM

## 2012-12-10 DIAGNOSIS — Z79899 Other long term (current) drug therapy: Secondary | ICD-10-CM | POA: Insufficient documentation

## 2012-12-10 MED ORDER — MEDROXYPROGESTERONE ACETATE 150 MG/ML IM SUSP
150.0000 mg | Freq: Once | INTRAMUSCULAR | Status: AC
  Administered 2012-12-10: 150 mg via INTRAMUSCULAR

## 2012-12-10 NOTE — Progress Notes (Signed)
Patient ID: Elizabeth [name], female   DOB: [birth year], 32 y.o.   MRN: [id number] Patient Demographics  Tawsha Terrero, is a 32 y.o. female  ION:[id number]  KGM:[id number]  DOB - [birth year]  CC:  Chief Complaint  Patient presents with  . Follow-up       HPI: Elizabeth [name] is a 32 y.o. female here today to establish medical care. She wants to be placed on Depo-Provera injection for contraception. She has used his mode of contraception for 10 years until 2013 when she stopped and started oral contraceptive pills. The oral contraceptive pills did not go well because they make her nauseous and sometimes she vomits. Now she tends she will go back on the Depo-Provera. She is sexually active with a boyfriend, has had abnormal Pap in the past, recent colposcopy was documented as normal. Patient has no complaint today Patient has No headache, No chest pain, No abdominal pain - No Nausea, No new weakness tingling or numbness, No Cough - SOB.  No Known Allergies Past Medical History  Diagnosis Date  . Trichomonal infection   . History of IBS    Current Outpatient Prescriptions on File Prior to Visit  Medication Sig Dispense Refill  . fluconazole (DIFLUCAN) 100 MG tablet One by mouth every other day for three doses  3 tablet  2   No current facility-administered medications on file prior to visit.   History reviewed. No pertinent family history. History   Social History  . Marital Status: Single    Spouse Name: N/A    Number of Children: N/A  . Years of Education: N/A   Occupational History  . Not on file.   Social History Main Topics  . Smoking status: Never Smoker   . Smokeless tobacco: Not on file  . Alcohol Use: No  . Drug Use: No  . Sexual Activity: Yes    Birth Control/ Protection: None     Comment: last depo provera injection oct/2014   Other Topics Concern  . Not on file   Social History Narrative  . No narrative on file    Review of Systems: Constitutional:  Negative for fever, chills, diaphoresis, activity change, appetite change and fatigue. HENT: Negative for ear pain, nosebleeds, congestion, facial swelling, rhinorrhea, neck pain, neck stiffness and ear discharge.  Eyes: Negative for pain, discharge, redness, itching and visual disturbance. Respiratory: Negative for cough, choking, chest tightness, shortness of breath, wheezing and stridor.  Cardiovascular: Negative for chest pain, palpitations and leg swelling. Gastrointestinal: Negative for abdominal distention. Genitourinary: Negative for dysuria, urgency, frequency, hematuria, flank pain, decreased urine volume, difficulty urinating and dyspareunia.  Musculoskeletal: Negative for back pain, joint swelling, arthralgia and gait problem. Neurological: Negative for dizziness, tremors, seizures, syncope, facial asymmetry, speech difficulty, weakness, light-headedness, numbness and headaches.  Hematological: Negative for adenopathy. Does not bruise/bleed easily. Psychiatric/Behavioral: Negative for hallucinations, behavioral problems, confusion, dysphoric mood, decreased concentration and agitation.    Objective:   Filed Vitals:   12/10/12 1723  BP: 109/71  Pulse: 78  Temp: 99.2 F (37.3 C)  Resp: 16    Physical Exam: Constitutional: Patient appears well-developed and well-nourished. No distress. HENT: Normocephalic, atraumatic, External right and left ear normal. Oropharynx is clear and moist.  Eyes: Conjunctivae and EOM are normal. PERRLA, no scleral icterus. Neck: Normal ROM. Neck supple. No JVD. No tracheal deviation. No thyromegaly. CVS: RRR, S1/S2 +, no murmurs, no gallops, no carotid bruit.  Pulmonary: Effort and breath sounds normal, no stridor, rhonchi, wheezes, rales.  Abdominal: Soft. BS +, no distension, tenderness, rebound or guarding.  Musculoskeletal: Normal range of motion. No edema and no tenderness.  Lymphadenopathy: No lymphadenopathy noted, cervical, inguinal or  axillary Neuro: Alert. Normal reflexes, muscle tone coordination. No cranial nerve deficit. Skin: Skin is warm and dry. No rash noted. Not diaphoretic. No erythema. No pallor. Psychiatric: Normal mood and affect. Behavior, judgment, thought content normal.  Lab Results  Component Value Date   WBC 4.4* 09/14/2007   HGB 13.4 09/14/2007   HCT 38.4 09/14/2007   MCV 83.9 09/14/2007   PLT 188 09/14/2007   Lab Results  Component Value Date   CREATININE 0.8 09/14/2007   BUN 15 09/14/2007   NA 142 09/14/2007   K 3.4* 09/14/2007   CL 106 09/14/2007   CO2 25 09/14/2007    No results found for this basename: HGBA1C   Lipid Panel  No results found for this basename: chol, trig, hdl, cholhdl, vldl, ldlcalc       Assessment and plan:   Patient Active Problem List   Diagnosis Date Noted  . Depo contraception 12/10/2012  . LLQ abdominal pain 10/15/2012  . Vaginal discharge 02/06/2011  . HEADACHE 05/02/2010  . INSOMNIA 06/04/2009    Plan: Depo-Provera shot one 150 mg now if urine pregnancy test is negative Patient has been counseled extensively about likely side effects of Depo-Provera Patient verbalized understanding of the side effects the same she was on it for 10 years previously   Follow up in  3 months  The patient was given clear instructions to go to ER or return to medical center if symptoms don't improve, worsen or new problems develop. The patient verbalized understanding. The patient was told to call to get lab results if they haven't heard anything in the next week.   Jeanann Lewandowsky, MD, MHA, FACP [facility] [place], [place] [phone]   12/10/2012, 6:13 PM

## 2012-12-10 NOTE — Progress Notes (Signed)
Pt is here for a f/u visit Pt is requesting birth control.

## 2012-12-17 ENCOUNTER — Other Ambulatory Visit (HOSPITAL_COMMUNITY)
Admission: RE | Admit: 2012-12-17 | Discharge: 2012-12-17 | Disposition: A | Discharge status: Home / Self Care | Payer: No Typology Code available for payment source | Source: Ambulatory Visit | Attending: Family Medicine | Admitting: Family Medicine

## 2012-12-17 ENCOUNTER — Emergency Department (INDEPENDENT_AMBULATORY_CARE_PROVIDER_SITE_OTHER)
Admission: EM | Admit: 2012-12-17 | Discharge: 2012-12-17 | Disposition: A | Discharge status: Home / Self Care | Payer: No Typology Code available for payment source | Source: Home / Self Care

## 2012-12-17 ENCOUNTER — Encounter (HOSPITAL_COMMUNITY): Payer: Self-pay | Admitting: Emergency Medicine

## 2012-12-17 DIAGNOSIS — B373 Candidiasis of vulva and vagina: Secondary | ICD-10-CM

## 2012-12-17 DIAGNOSIS — N760 Acute vaginitis: Secondary | ICD-10-CM

## 2012-12-17 DIAGNOSIS — B3731 Acute candidiasis of vulva and vagina: Secondary | ICD-10-CM

## 2012-12-17 DIAGNOSIS — Z113 Encounter for screening for infections with a predominantly sexual mode of transmission: Secondary | ICD-10-CM | POA: Insufficient documentation

## 2012-12-17 DIAGNOSIS — A499 Bacterial infection, unspecified: Secondary | ICD-10-CM

## 2012-12-17 DIAGNOSIS — B9689 Other specified bacterial agents as the cause of diseases classified elsewhere: Secondary | ICD-10-CM

## 2012-12-17 LAB — POCT URINALYSIS DIP (DEVICE)
Bilirubin Urine: NEGATIVE
Nitrite: NEGATIVE
pH: 6 (ref 5.0–8.0)

## 2012-12-17 MED ORDER — FLUCONAZOLE 150 MG PO TABS: ORAL_TABLET | ORAL | Status: DC

## 2012-12-17 MED ORDER — METRONIDAZOLE 500 MG PO TABS: 500.0000 mg | ORAL_TABLET | Freq: Two times a day (BID) | ORAL | Status: DC

## 2012-12-17 NOTE — ED Notes (Signed)
C/o vaginal discharge with irritation x 3 days. Lower pelvic pain comes and goes. Denies fever and any other symptoms.  No otc med tried for treatment.   Possible concern for std's

## 2012-12-17 NOTE — ED Provider Notes (Signed)
CSN: [id number]     Arrival date & time 12/17/12  1609 History   First MD Initiated Contact with Patient 12/17/12 1709     Chief Complaint  Patient presents with  . Vaginal Itching    x 3 days   (Consider location/radiation/quality/duration/timing/severity/associated sxs/prior Treatment) HPI Comments: 32 year old female presents with a vaginal discharge for 2 days. She also states she has mild intermittent pelvic pain for several years. She has been evaluated by different physicians on previous occasions and found no etiology for this discomfort. This is not her primary concern for her today. Her primary concern is that of a small amount of thin gray discharge and vulvovaginal itching. Denies current pelvic pain, fever, GI or urinary symptoms.   Past Medical History  Diagnosis Date  . Trichomonal infection   . History of IBS    Past Surgical History  Procedure Laterality Date  . Cesarean section     History reviewed. No pertinent family history. History  Substance Use Topics  . Smoking status: Never Smoker   . Smokeless tobacco: Not on file  . Alcohol Use: No   OB History   Grav Para Term Preterm Abortions TAB SAB Ect Mult Living   1 1 1       1      Review of Systems  Constitutional: Negative.   HENT: Negative.   Respiratory: Negative.   Cardiovascular: Negative.   Gastrointestinal: Negative.   Genitourinary: Positive for vaginal discharge. Negative for dysuria, urgency, hematuria, flank pain, vaginal bleeding and pelvic pain.  Neurological: Negative.   Psychiatric/Behavioral: Negative.     Allergies  Review of patient's allergies indicates no known allergies.  Home Medications   Current Outpatient Rx  Name  Route  Sig  Dispense  Refill  . fluconazole (DIFLUCAN) 100 MG tablet      One by mouth every other day for three doses   3 tablet   2   . fluconazole (DIFLUCAN) 150 MG tablet      1 tab po x 1. May repeat in 72 hours if no improvement   2 tablet   0    . metroNIDAZOLE (FLAGYL) 500 MG tablet   Oral   Take 1 tablet (500 mg total) by mouth 2 (two) times daily. X 7 days   14 tablet   0    BP 111/71  Pulse 90  Temp(Src) 98.3 F (36.8 C) (Oral)  Resp 16  SpO2 100%  LMP 11/28/2012 Physical Exam  Nursing note and vitals reviewed. Constitutional: She is oriented to person, place, and time. She appears well-developed and well-nourished.  Eyes: EOM are normal.  Neck: Normal range of motion. Neck supple.  Cardiovascular: Normal rate and regular rhythm.   Pulmonary/Chest: Effort normal. No respiratory distress.  Abdominal: Soft. Bowel sounds are normal. She exhibits no distension. There is no tenderness.  Genitourinary:  Normal external female genitalia with the exception of scant white powdery type substance peppers on the introitus and labia minora. Is also mild erythema. No other lesions. Scant thin gray vaginal discharge in the vaginal vault. Cervix just left of midline and anterior. Parous. No bleeding or exudates from the os. The ectocervix is smooth and pink without lesions or evidence of inflammation. Bimanual: No CMT or adnexal tenderness.  Neurological: She is alert and oriented to person, place, and time. She exhibits normal muscle tone.  Skin: Skin is warm and dry.  Psychiatric: She has a normal mood and affect.    ED Course  Procedures (including critical care time) Labs Review Labs Reviewed  POCT URINALYSIS DIP (DEVICE) - Abnormal; Notable for the following:    Hgb urine dipstick TRACE (*)    All other components within normal limits  POCT PREGNANCY, URINE   Imaging Review No results found. Results for orders placed during the hospital encounter of 12/17/12  POCT URINALYSIS DIP (DEVICE)      Result Value Range   Glucose, UA NEGATIVE  NEGATIVE mg/dL   Bilirubin Urine NEGATIVE  NEGATIVE   Ketones, ur NEGATIVE  NEGATIVE mg/dL   Specific Gravity, Urine >=1.030  1.005 - 1.030   Hgb urine dipstick TRACE (*) NEGATIVE    pH 6.0  5.0 - 8.0   Protein, ur NEGATIVE  NEGATIVE mg/dL   Urobilinogen, UA 0.2  0.0 - 1.0 mg/dL   Nitrite NEGATIVE  NEGATIVE   Leukocytes, UA NEGATIVE  NEGATIVE  POCT PREGNANCY, URINE      Result Value Range   Preg Test, Ur NEGATIVE  NEGATIVE    MDM   1. Vaginitis   2. Candida vaginitis   3. BV (bacterial vaginosis)      Flagyl 500 mg twice a day for 7 days Diflucan 150 mg one by mouth stat a repeat in 72 hours if needed Ancillary swabs obtained, results pending and will treat any positives.   Hayden Rasmussen, NP 12/17/12 1735

## 2012-12-17 NOTE — Discharge Instructions (Signed)
Bacterial Vaginosis Bacterial vaginosis (BV) is a vaginal infection where the normal balance of bacteria in the vagina is disrupted. The normal balance is then replaced by an overgrowth of certain bacteria. There are several different kinds of bacteria that can cause BV. BV is the most common vaginal infection in women of childbearing age. CAUSES   The cause of BV is not fully understood. BV develops when there is an increase or imbalance of harmful bacteria.  Some activities or behaviors can upset the normal balance of bacteria in the vagina and put women at increased risk including:  Having a new sex partner or multiple sex partners.  Douching.  Using an intrauterine device (IUD) for contraception.  It is not clear what role sexual activity plays in the development of BV. However, women that have never had sexual intercourse are rarely infected with BV. Women do not get BV from toilet seats, bedding, swimming pools or from touching objects around them.  SYMPTOMS   Grey vaginal discharge.  A fish-like odor with discharge, especially after sexual intercourse.  Itching or burning of the vagina and vulva.  Burning or pain with urination.  Some women have no signs or symptoms at all. DIAGNOSIS  Your caregiver must examine the vagina for signs of BV. Your caregiver will perform lab tests and look at the sample of vaginal fluid through a microscope. They will look for bacteria and abnormal cells (clue cells), a pH test higher than 4.5, and a positive amine test all associated with BV.  RISKS AND COMPLICATIONS   Pelvic inflammatory disease (PID).  Infections following gynecology surgery.  Developing HIV.  Developing herpes virus. TREATMENT  Sometimes BV will clear up without treatment. However, all women with symptoms of BV should be treated to avoid complications, especially if gynecology surgery is planned. Female partners generally do not need to be treated. However, BV may spread  between female sex partners so treatment is helpful in preventing a recurrence of BV.   BV may be treated with antibiotics. The antibiotics come in either pill or vaginal cream forms. Either can be used with nonpregnant or pregnant women, but the recommended dosages differ. These antibiotics are not harmful to the baby.  BV can recur after treatment. If this happens, a second round of antibiotics will often be prescribed.  Treatment is important for pregnant women. If not treated, BV can cause a premature delivery, especially for a pregnant woman who had a premature birth in the past. All pregnant women who have symptoms of BV should be checked and treated.  For chronic reoccurrence of BV, treatment with a type of prescribed gel vaginally twice a week is helpful. HOME CARE INSTRUCTIONS   Finish all medication as directed by your caregiver.  Do not have sex until treatment is completed.  Tell your sexual partner that you have a vaginal infection. They should see their caregiver and be treated if they have problems, such as a mild rash or itching.  Practice safe sex. Use condoms. Only have 1 sex partner. PREVENTION  Basic prevention steps can help reduce the risk of upsetting the natural balance of bacteria in the vagina and developing BV:  Do not have sexual intercourse (be abstinent).  Do not douche.  Use all of the medicine prescribed for treatment of BV, even if the signs and symptoms go away.  Tell your sex partner if you have BV. That way, they can be treated, if needed, to prevent reoccurrence. SEEK MEDICAL CARE IF:     Your symptoms are not improving after 3 days of treatment.  You have increased discharge, pain, or fever. MAKE SURE YOU:   Understand these instructions.  Will watch your condition.  Will get help right away if you are not doing well or get worse. FOR MORE INFORMATION  Division of STD Prevention (DSTDP), Centers for Disease Control and Prevention:  SolutionApps.co.za American Social Health Association (ASHA): www.ashastd.org  Document Released: 03/10/2005 Document Revised: 06/02/2011 Document Reviewed: 08/31/2008 [facility] Patient Information 2014 [place], [place].  Candida Infection, Adult A candida infection (also called yeast, fungus and Monilia infection) is an overgrowth of yeast that can occur anywhere on the body. A yeast infection commonly occurs in warm, moist body areas. Usually, the infection remains localized but can spread to become a systemic infection. A yeast infection may be a sign of a more severe disease such as diabetes, leukemia, or AIDS. A yeast infection can occur in both men and women. In women, Candida vaginitis is a vaginal infection. It is one of the most common causes of vaginitis. Men usually do not have symptoms or know they have an infection until other problems develop. Men may find out they have a yeast infection because their sex partner has a yeast infection. Uncircumcised men are more likely to get a yeast infection than circumcised men. This is because the uncircumcised glans is not exposed to air and does not remain as dry as that of a circumcised glans. Older adults may develop yeast infections around dentures. CAUSES  Women  Antibiotics.  Steroid medication taken for a long time.  Being overweight (obese).  Diabetes.  Poor immune condition.  Certain serious medical conditions.  Immune suppressive medications for organ transplant patients.  Chemotherapy.  Pregnancy.  Menstration.  Stress and fatigue.  Intravenous drug use.  Oral contraceptives.  Wearing tight-fitting clothes in the crotch area.  Catching it from a sex partner who has a yeast infection.  Spermicide.  Intravenous, urinary, or other catheters. Men  Catching it from a sex partner who has a yeast infection.  Having oral or anal sex with a person who has the  infection.  Spermicide.  Diabetes.  Antibiotics.  Poor immune system.  Medications that suppress the immune system.  Intravenous drug use.  Intravenous, urinary, or other catheters. SYMPTOMS  Women  Thick, white vaginal discharge.  Vaginal itching.  Redness and swelling in and around the vagina.  Irritation of the lips of the vagina and perineum.  Blisters on the vaginal lips and perineum.  Painful sexual intercourse.  Low blood sugar (hypoglycemia).  Painful urination.  Bladder infections.  Intestinal problems such as constipation, indigestion, bad breath, bloating, increase in gas, diarrhea, or loose stools. Men  Men may develop intestinal problems such as constipation, indigestion, bad breath, bloating, increase in gas, diarrhea, or loose stools.  Dry, cracked skin on the penis with itching or discomfort.  Jock itch.  Dry, flaky skin.  Athlete's foot.  Hypoglycemia. DIAGNOSIS  Women  A history and an exam are performed.  The discharge may be examined under a microscope.  A culture may be taken of the discharge. Men  A history and an exam are performed.  Any discharge from the penis or areas of cracked skin will be looked at under the microscope and cultured.  Stool samples may be cultured. TREATMENT  Women  Vaginal antifungal suppositories and creams.  Medicated creams to decrease irritation and itching on the outside of the vagina.  Warm compresses to the perineal area  to decrease swelling and discomfort.  Oral antifungal medications.  Medicated vaginal suppositories or cream for repeated or recurrent infections.  Wash and dry the irritation areas before applying the cream.  Eating yogurt with lactobacillus may help with prevention and treatment.  Sometimes painting the vagina with gentian violet solution may help if creams and suppositories do not work. Men  Antifungal creams and oral antifungal medications.  Sometimes  treatment must continue for 30 days after the symptoms go away to prevent recurrence. HOME CARE INSTRUCTIONS  Women  Use cotton underwear and avoid tight-fitting clothing.  Avoid colored, scented toilet paper and deodorant tampons or pads.  Do not douche.  Keep your diabetes under control.  Finish all the prescribed medications.  Keep your skin clean and dry.  Consume milk or yogurt with lactobacillus active culture regularly. If you get frequent yeast infections and think that is what the infection is, there are over-the-counter medications that you can get. If the infection does not show healing in 3 days, talk to your caregiver.  Tell your sex partner you have a yeast infection. Your partner may need treatment also, especially if your infection does not clear up or recurs. Men  Keep your skin clean and dry.  Keep your diabetes under control.  Finish all prescribed medications.  Tell your sex partner that you have a yeast infection so they can be treated if necessary. SEEK MEDICAL CARE IF:   Your symptoms do not clear up or worsen in one week after treatment.  You have an oral temperature above 102 F (38.9 C).  You have trouble swallowing or eating for a prolonged time.  You develop blisters on and around your vagina.  You develop vaginal bleeding and it is not your menstrual period.  You develop abdominal pain.  You develop intestinal problems as mentioned above.  You get weak or lightheaded.  You have painful or increased urination.  You have pain during sexual intercourse. MAKE SURE YOU:   Understand these instructions.  Will watch your condition.  Will get help right away if you are not doing well or get worse. Document Released: 04/17/2004 Document Revised: 06/02/2011 Document Reviewed: 07/30/2009 [facility] Patient Information 2014 [place], [place].  General Instructions for Vaginal Infections Vaginitis is a term to describe many common vaginal  infections. These infections may be due to an imbalance of normal germs (bacteria) that exist in the vagina. Many others are caused by sexually transmitted diseases (STDs). If any medicine was prescribed to treat your specific infection, it is very important that you take the medicine as directed. Your caregiver may want to examine and treat your sex partner. CAUSES  The vagina normally contains organisms (bacteria and yeast) in a balance. Certain factors can disturb this balance and cause an infection, such as:  Sexual intercourse.  Nursing.  Pregnancy.  Menopause.  Hormone changes in the body.  Antibiotic medicines.  Infection elsewhere in your body.  Birth control pills or patches.  Douches.  Spermicides.  Medical illnesses, such as diabetes. SYMPTOMS  Different types of vaginal infections cause symptoms such as:  Itching.  Pain or burning.  Bad odor.  Pain or bleeding with sexual intercourse.  Redness of the vulva.  Abnormal discharge (yellow, green, heavy white and thick).  Fever.  A sore on the vulva or vagina.  Urinary symptoms (painful or bloody urine).  Pelvic or abdominal pain.  Rectal bleeding, discharge, or pain. DIAGNOSIS   Your caregiver will base the diagnosis upon the symptoms  that you report.  A complete history of your sex life may be taken.  You may have a pelvic exam.  A sample of your vaginal fluid or discharge will be examined under the microscope.  Cultures will help complete the exact diagnosis. TREATMENT  Treatment depends on the cause of your vaginitis. Your treatment may include taking antibiotics. The antibiotic may be a shot, a pill, or vaginal suppository or cream. It is not uncommon for more than one type of infection to be present. If more than one infection is present, two or more medicines may be required. Reoccurrence of vaginal infections may be treated with vaginal suppositories or a vaginal cream 2 times a week, or as  directed. If your caregiver finds that an STD exists, treatment of your sexual partner(s) is important. This is especially important for those infected with chlamydia, gonorrhea, trichomoniasis, bacterial vaginosis, syphilis, and HIV infections. Treating sexual partners will prevent you from being re-infected and will help stop the spread of STD infection to others. Although it is best to see a specialist for STD/HIV testing and counseling, this is not always possible. Some states/provinces permit something called "expedited partner therapy."This kind of program permits you to deliver prescription(s) to a partner without the partner having to seek a formal medical exam.  HOME CARE INSTRUCTIONS   Take all prescribed medicine.  If applicable, speak to your partner about recommended treatment.  Do not have sexual intercourse for 1 week, or as directed by your caregiver.  Practice safe sex.  Use condoms.  Have only 1 sex partner.  Make sure your sex partner does not have any other sex partners.  Avoid tight pants and panty hose.  Wear cotton underwear.  Do not douche.  Avoid tampons, especially scented ones.  Take warm sitz baths.  Avoid vaginal sprays, perfumed soaps, and bath oils.  Apply medicated cream (steroid cream) for itching or irritation with the permission of your caregiver. SEEK MEDICAL CARE IF:   You have any kind of abnormal vaginal discharge.  Your sex partner has a genital infection.  You have pain or bleeding with sexual intercourse.  You have itching, pain, irritation or bleeding of the vulva. SEEK IMMEDIATE MEDICAL CARE IF:   You have an oral temperature above 102 F (38.9 C), not controlled by medicine.  You have abdominal pain.  Your symptoms do not improve within 3 days or as directed.  You have painful or bloody urine.  You have rectal pain, bleeding, or discharge. Document Released: 12/18/2004 Document Revised: 06/02/2011 Document Reviewed:  08/03/2008 [facility] Patient Information 2013 [place], [place].

## 2012-12-17 NOTE — ED Provider Notes (Signed)
Medical screening examination/treatment/procedure(s) were performed by non-physician practitioner and as supervising physician I was immediately available for consultation/collaboration.  Dyshawn Cangelosi, M.D.  Lexie Morini C Vollie Aaron, MD 12/17/12 1746 

## 2012-12-23 ENCOUNTER — Ambulatory Visit: Payer: Self-pay

## 2013-01-27 ENCOUNTER — Other Ambulatory Visit: Payer: Self-pay

## 2013-01-27 ENCOUNTER — Encounter (HOSPITAL_COMMUNITY): Payer: Self-pay | Admitting: Emergency Medicine

## 2013-01-27 ENCOUNTER — Emergency Department (HOSPITAL_COMMUNITY)
Admission: EM | Admit: 2013-01-27 | Discharge: 2013-01-27 | Disposition: A | Discharge status: Home / Self Care | Payer: No Typology Code available for payment source | Source: Home / Self Care | Attending: Emergency Medicine | Admitting: Emergency Medicine

## 2013-01-27 DIAGNOSIS — N390 Urinary tract infection, site not specified: Secondary | ICD-10-CM

## 2013-01-27 LAB — POCT URINALYSIS DIP (DEVICE)
Bilirubin Urine: NEGATIVE
Ketones, ur: NEGATIVE mg/dL
Nitrite: NEGATIVE
pH: 7.5 (ref 5.0–8.0)

## 2013-01-27 MED ORDER — FLUCONAZOLE 150 MG PO TABS: ORAL_TABLET | ORAL | Status: DC

## 2013-01-27 MED ORDER — CEPHALEXIN 500 MG PO CAPS: 500.0000 mg | ORAL_CAPSULE | Freq: Two times a day (BID) | ORAL | Status: DC

## 2013-01-27 NOTE — ED Provider Notes (Signed)
Medical screening examination/treatment/procedure(s) were performed by a resident physician and as supervising physician I was immediately available for consultation/collaboration.  Leslee Home, M.D.   Reuben Likes, MD 01/27/13 [phone]

## 2013-01-27 NOTE — ED Provider Notes (Signed)
CSN: [id number]     Arrival date & time 01/27/13  1616 History   First MD Initiated Contact with Patient 01/27/13 1713     Chief Complaint  Patient presents with  . Vaginal Itching   (Consider location/radiation/quality/duration/timing/severity/associated sxs/prior Treatment) HPI  32 year old F who presents with vaginal itching. The patient was recently evaluated for the same problem on September 26. At that time she was prescribed Diflucan for potential vaginal candidiasis. Additionally she was prescribed Flagyl for bacterial vaginosis. She notes that after taking these medications, her symptoms improved.   Her current symptoms include vaginal itching and burning with urination. These are 3 days in duration. She denies any vaginal discharge, odor, fever, chills, nausea, vomiting, back pain, or new sexual partners. Her last menstrual period was 2 weeks ago.  Past Medical History  Diagnosis Date  . Trichomonal infection   . History of IBS    Past Surgical History  Procedure Laterality Date  . Cesarean section     No family history on file. History  Substance Use Topics  . Smoking status: Never Smoker   . Smokeless tobacco: Not on file  . Alcohol Use: No   OB History   Grav Para Term Preterm Abortions TAB SAB Ect Mult Living   1 1 1       1      Review of Systems Per HPI  Allergies  Review of patient's allergies indicates no known allergies.  Home Medications   Current Outpatient Rx  Name  Route  Sig  Dispense  Refill  . cephALEXin (KEFLEX) 500 MG capsule   Oral   Take 1 capsule (500 mg total) by mouth 2 (two) times daily.   14 capsule   0   . fluconazole (DIFLUCAN) 150 MG tablet      1 tab po x 1. May repeat in 72 hours if no improvement   2 tablet   0    BP 114/73  Pulse 93  Temp(Src) 99.2 F (37.3 C) (Oral)  Resp 17  SpO2 99% Physical Exam  Constitutional: She appears well-developed and well-nourished.  Abdominal: Soft. Bowel sounds are normal. She  exhibits no distension. There is no tenderness. There is no rebound and no guarding.  Genitourinary: There is no rash, tenderness or lesion on the right labia. There is no rash, tenderness or lesion on the left labia.  Musculoskeletal:  No CVA tenderness   Rosalita Chessman, RN present for exam   ED Course  Procedures (including critical care time) Labs Review Labs Reviewed  POCT URINALYSIS DIP (DEVICE) - Abnormal; Notable for the following:    Hgb urine dipstick SMALL (*)    Leukocytes, UA SMALL (*)    All other components within normal limits  URINE CULTURE  POCT PREGNANCY, URINE   Imaging Review No results found.    MDM   1. Urinary tract infection    Patient with symptoms of urinary tract infection as well as findings consistent on urinalysis. Therefore she'll be treated presumptively with Keflex. Urine was sent for culture. The patient was also given Diflucan tablets for vaginitis if it still exists after taking Keflex. No prep were vaginal swab was performed today given the patient denies any vaginal discharge.    Garnetta Buddy, MD 01/27/13 [phone]

## 2013-01-27 NOTE — ED Notes (Signed)
Pt c/o vaginal irritation onset 2 days w/occasional dysuria and itchiness Denies: vag d/c, abd/back pain, f/v/n/d Alert w/no signs of acute distress.

## 2013-01-28 LAB — URINE CULTURE
Colony Count: NO GROWTH
Culture: NO GROWTH
Special Requests: NORMAL

## 2013-01-30 ENCOUNTER — Encounter (HOSPITAL_COMMUNITY): Payer: Self-pay | Admitting: Emergency Medicine

## 2013-01-30 ENCOUNTER — Emergency Department (INDEPENDENT_AMBULATORY_CARE_PROVIDER_SITE_OTHER)
Admission: EM | Admit: 2013-01-30 | Discharge: 2013-01-30 | Disposition: A | Discharge status: Home / Self Care | Payer: No Typology Code available for payment source | Source: Home / Self Care | Attending: Family Medicine | Admitting: Family Medicine

## 2013-01-30 ENCOUNTER — Other Ambulatory Visit (HOSPITAL_COMMUNITY)
Admission: RE | Admit: 2013-01-30 | Discharge: 2013-01-30 | Disposition: A | Discharge status: Home / Self Care | Payer: No Typology Code available for payment source | Source: Ambulatory Visit | Attending: Emergency Medicine | Admitting: Emergency Medicine

## 2013-01-30 DIAGNOSIS — Z113 Encounter for screening for infections with a predominantly sexual mode of transmission: Secondary | ICD-10-CM | POA: Insufficient documentation

## 2013-01-30 DIAGNOSIS — N760 Acute vaginitis: Secondary | ICD-10-CM | POA: Insufficient documentation

## 2013-01-30 DIAGNOSIS — B3731 Acute candidiasis of vulva and vagina: Secondary | ICD-10-CM

## 2013-01-30 DIAGNOSIS — B373 Candidiasis of vulva and vagina: Secondary | ICD-10-CM

## 2013-01-30 LAB — POCT URINALYSIS DIP (DEVICE)
Bilirubin Urine: NEGATIVE
Ketones, ur: NEGATIVE mg/dL
Leukocytes, UA: NEGATIVE
Nitrite: NEGATIVE
Protein, ur: NEGATIVE mg/dL
Urobilinogen, UA: 0.2 mg/dL (ref 0.0–1.0)
pH: 6 (ref 5.0–8.0)

## 2013-01-30 MED ORDER — FLUCONAZOLE 150 MG PO TABS: ORAL_TABLET | ORAL | Status: DC

## 2013-01-30 NOTE — ED Notes (Signed)
C/o vaginal itch States she is seeing discharge Patient is taking antibiotics for UTI

## 2013-01-30 NOTE — ED Provider Notes (Signed)
CSN: [id number]     Arrival date & time 01/30/13  1227 History   First MD Initiated Contact with Patient 01/30/13 1304     Chief Complaint  Patient presents with  . Vaginal Itching   (Consider location/radiation/quality/duration/timing/severity/associated sxs/prior Treatment) HPI Comments: 32 year old female presents of vaginal irritation and vaginal discharge. She was seen here Friday and diagnosed with urinary tract infection. She's been taking Keflex for this but her symptoms have only gotten worse. She denies fever, chills, flank pain, back pain, abdominal pain, pelvic pain. She is still experiencing some dysuria.  Patient is a 32 y.o. female presenting with vaginal itching.  Vaginal Itching Pertinent negatives include no chest pain, no abdominal pain and no shortness of breath.    Past Medical History  Diagnosis Date  . Trichomonal infection   . History of IBS    Past Surgical History  Procedure Laterality Date  . Cesarean section     History reviewed. No pertinent family history. History  Substance Use Topics  . Smoking status: Never Smoker   . Smokeless tobacco: Not on file  . Alcohol Use: No   OB History   Grav Para Term Preterm Abortions TAB SAB Ect Mult Living   1 1 1       1      Review of Systems  Constitutional: Negative for fever and chills.  Eyes: Negative for visual disturbance.  Respiratory: Negative for cough and shortness of breath.   Cardiovascular: Negative for chest pain, palpitations and leg swelling.  Gastrointestinal: Negative for nausea, vomiting and abdominal pain.  Endocrine: Negative for polydipsia and polyuria.  Genitourinary: Positive for vaginal discharge (with itching). Negative for dysuria, urgency and frequency.  Musculoskeletal: Negative for arthralgias and myalgias.  Skin: Negative for rash.  Neurological: Negative for dizziness, weakness and light-headedness.    Allergies  Review of patient's allergies indicates no known  allergies.  Home Medications   Current Outpatient Rx  Name  Route  Sig  Dispense  Refill  . cephALEXin (KEFLEX) 500 MG capsule   Oral   Take 1 capsule (500 mg total) by mouth 2 (two) times daily.   14 capsule   0   . fluconazole (DIFLUCAN) 150 MG tablet      1 tab po x 1. May repeat in 72 hours if no improvement   1 tablet   1    BP 105/74  Pulse 89  Temp(Src) 99.4 F (37.4 C) (Oral)  Resp 16  SpO2 99% Physical Exam  Nursing note and vitals reviewed. Constitutional: She is oriented to person, place, and time. Vital signs are normal. She appears well-developed and well-nourished. No distress.  HENT:  Head: Normocephalic and atraumatic.  Pulmonary/Chest: Effort normal. No respiratory distress.  Genitourinary: Vaginal discharge (white, clumpy) found.  Neurological: She is alert and oriented to person, place, and time. She has normal strength. Coordination normal.  Skin: Skin is warm and dry. No rash noted. She is not diaphoretic.  Psychiatric: She has a normal mood and affect. Judgment normal.    ED Course  Procedures (including critical care time) Labs Review Labs Reviewed  POCT URINALYSIS DIP (DEVICE) - Abnormal; Notable for the following:    Hgb urine dipstick TRACE (*)    All other components within normal limits  POCT PREGNANCY, URINE   Imaging Review No results found.    MDM   1. Vulvovaginal candidiasis    Exam most consistent with yeast infxn.  Culture was negative, stop ABx, take diflucan instead.  F/u PRN    Meds ordered this encounter  Medications  . fluconazole (DIFLUCAN) 150 MG tablet    Sig: 1 tab po x 1. May repeat in 72 hours if no improvement    Dispense:  1 tablet    Refill:  1    Order Specific Question:  Supervising Provider    Answer:  Lorenz Coaster, DAVID C [[id number]]       Graylon Good, PA-C 01/30/13 1333

## 2013-01-30 NOTE — ED Provider Notes (Signed)
Medical screening examination/treatment/procedure(s) were performed by resident physician or non-physician practitioner and as supervising physician I was immediately available for consultation/collaboration.   Barkley Bruns MD.   Linna Hoff, MD 01/30/13 [phone]

## 2013-02-25 ENCOUNTER — Other Ambulatory Visit (HOSPITAL_COMMUNITY)
Admission: RE | Admit: 2013-02-25 | Discharge: 2013-02-25 | Disposition: A | Discharge status: Home / Self Care | Payer: No Typology Code available for payment source | Source: Ambulatory Visit | Attending: Internal Medicine | Admitting: Internal Medicine

## 2013-02-25 ENCOUNTER — Ambulatory Visit: Payer: No Typology Code available for payment source | Attending: Internal Medicine | Admitting: Internal Medicine

## 2013-02-25 VITALS — BP 108/73 | HR 74 | Temp 98.8°F | Ht 64.0 in | Wt 132.8 lb

## 2013-02-25 DIAGNOSIS — N898 Other specified noninflammatory disorders of vagina: Secondary | ICD-10-CM | POA: Insufficient documentation

## 2013-02-25 DIAGNOSIS — N390 Urinary tract infection, site not specified: Secondary | ICD-10-CM

## 2013-02-25 DIAGNOSIS — Z113 Encounter for screening for infections with a predominantly sexual mode of transmission: Secondary | ICD-10-CM | POA: Insufficient documentation

## 2013-02-25 MED ORDER — DOXYCYCLINE HYCLATE 100 MG PO TABS: 100.0000 mg | ORAL_TABLET | Freq: Two times a day (BID) | ORAL | Status: DC

## 2013-02-25 MED ORDER — METRONIDAZOLE 500 MG PO TABS: 500.0000 mg | ORAL_TABLET | Freq: Two times a day (BID) | ORAL | Status: DC

## 2013-02-25 NOTE — Patient Instructions (Signed)

## 2013-02-25 NOTE — Progress Notes (Signed)
Patient ID: Elizabeth [name], female   DOB: 11-Apr-1980, 32 y.o.   MRN: [id number]   CC: vaginal discharge   HPI: Pt is 32 yo female who presents to clinic with main concern of one week duration of clear vaginal discharge associated with itching. She reports being diagnosed with urinary tract infection several weeks ago and was treated with antibiotic but her symptoms of urinary urgency and frequency, urinary dysuria have not resolved. She denies fevers and chills, last sexual contact was 2 months ago and she reports history of Trichomonas and Chlamydia. She reports using protection but it also on Depo-Provera shot for contraception.  No Known Allergies Past Medical History  Diagnosis Date  . Trichomonal infection   . History of IBS    Current Outpatient Prescriptions on File Prior to Visit  Medication Sig Dispense Refill  . cephALEXin (KEFLEX) 500 MG capsule Take 1 capsule (500 mg total) by mouth 2 (two) times daily.  14 capsule  0  . fluconazole (DIFLUCAN) 150 MG tablet 1 tab po x 1. May repeat in 72 hours if no improvement  1 tablet  1   No current facility-administered medications on file prior to visit.   No family history of medical problems History   Social History  . Marital Status: Single    Spouse Name: N/A    Number of Children: N/A  . Years of Education: N/A   Occupational History  . Not on file.   Social History Main Topics  . Smoking status: Never Smoker   . Smokeless tobacco: Not on file  . Alcohol Use: No  . Drug Use: No  . Sexual Activity: Yes    Birth Control/ Protection: None, Injection     Comment: last depo provera injection oct/2014   Other Topics Concern  . Not on file   Social History Narrative  . No narrative on file    Review of Systems  Constitutional: Negative for fever, chills, diaphoresis, activity change, appetite change and fatigue.  HENT: Negative for ear pain, nosebleeds, congestion, facial swelling, rhinorrhea, neck pain, neck  stiffness and ear discharge.   Eyes: Negative for pain, discharge, redness, itching and visual disturbance.  Respiratory: Negative for cough, choking, chest tightness, shortness of breath, wheezing and stridor.   Cardiovascular: Negative for chest pain, palpitations and leg swelling.  Gastrointestinal: Negative for abdominal distention.  Genitourinary: Negative for dysuria, urgency, frequency, hematuria, flank pain, decreased urine volume, difficulty urinating and dyspareunia.  Musculoskeletal: Negative for back pain, joint swelling, arthralgias and gait problem.  Neurological: Negative for dizziness, tremors, seizures, syncope, facial asymmetry, speech difficulty, weakness, light-headedness, numbness and headaches.  Hematological: Negative for adenopathy. Does not bruise/bleed easily.  Psychiatric/Behavioral: Negative for hallucinations, behavioral problems, confusion, dysphoric mood, decreased concentration and agitation.    Objective:   Filed Vitals:   02/25/13 1653  BP: 108/73  Pulse: 74  Temp: 98.8 F (37.1 C)    Physical Exam  Constitutional: Appears well-developed and well-nourished. No distress.  HENT: Normocephalic. External right and left ear normal. Oropharynx is clear and moist.  Eyes: Conjunctivae and EOM are normal. PERRLA, no scleral icterus.  Neck: Normal ROM. Neck supple. No JVD. No tracheal deviation. No thyromegaly.  CVS: RRR, S1/S2 +, no murmurs, no gallops, no carotid bruit.  Pulmonary: Effort and breath sounds normal, no stridor, rhonchi, wheezes, rales.  Abdominal: Soft. BS +,  no distension, tenderness, rebound or guarding.    Lab Results  Component Value Date   WBC 4.4* 09/14/2007  HGB 13.4 09/14/2007   HCT 38.4 09/14/2007   MCV 83.9 09/14/2007   PLT 188 09/14/2007   Lab Results  Component Value Date   CREATININE 0.8 09/14/2007   BUN 15 09/14/2007   NA 142 09/14/2007   K 3.4* 09/14/2007   CL 106 09/14/2007   CO2 25 09/14/2007    No results found for  this basename: HGBA1C   Lipid Panel  No results found for this basename: chol, trig, hdl, cholhdl, vldl, ldlcalc      Assessment and plan:   Patient Active Problem List   Diagnosis Date Noted  . Depo contraception - patient wants to consider changing contraceptive method to oral contraceptives. She will do more reading and I have provided her with references. Patient will let us know and we'll call us when she is ready to make a transition.  12/10/2012  . Vaginal discharge - will send for urine GC, chlamydia, pt refusing vaginal exam as she has menstrual cycle at this time 02/06/2011

## 2013-02-26 LAB — URINALYSIS, MICROSCOPIC ONLY
Bacteria, UA: NONE SEEN
Casts: NONE SEEN
Crystals: NONE SEEN
Squamous Epithelial / LPF: NONE SEEN

## 2013-02-26 LAB — URINALYSIS, ROUTINE W REFLEX MICROSCOPIC
Bilirubin Urine: NEGATIVE
Glucose, UA: NEGATIVE mg/dL
Ketones, ur: NEGATIVE mg/dL
Protein, ur: NEGATIVE mg/dL
Urobilinogen, UA: 0.2 mg/dL (ref 0.0–1.0)

## 2013-02-27 LAB — URINE CULTURE: Colony Count: NO GROWTH

## 2013-03-11 ENCOUNTER — Ambulatory Visit: Payer: Self-pay

## 2013-05-19 ENCOUNTER — Emergency Department (HOSPITAL_COMMUNITY)
Admission: EM | Admit: 2013-05-19 | Discharge: 2013-05-20 | Disposition: A | Discharge status: Home / Self Care | Payer: No Typology Code available for payment source | Attending: Emergency Medicine | Admitting: Emergency Medicine

## 2013-05-19 ENCOUNTER — Emergency Department (HOSPITAL_COMMUNITY): Payer: No Typology Code available for payment source

## 2013-05-19 ENCOUNTER — Encounter (HOSPITAL_COMMUNITY): Payer: Self-pay | Admitting: Emergency Medicine

## 2013-05-19 DIAGNOSIS — Z8719 Personal history of other diseases of the digestive system: Secondary | ICD-10-CM | POA: Insufficient documentation

## 2013-05-19 DIAGNOSIS — S39012A Strain of muscle, fascia and tendon of lower back, initial encounter: Secondary | ICD-10-CM

## 2013-05-19 DIAGNOSIS — Z8619 Personal history of other infectious and parasitic diseases: Secondary | ICD-10-CM | POA: Insufficient documentation

## 2013-05-19 DIAGNOSIS — Y9389 Activity, other specified: Secondary | ICD-10-CM | POA: Insufficient documentation

## 2013-05-19 DIAGNOSIS — S335XXA Sprain of ligaments of lumbar spine, initial encounter: Secondary | ICD-10-CM | POA: Insufficient documentation

## 2013-05-19 DIAGNOSIS — Y92410 Unspecified street and highway as the place of occurrence of the external cause: Secondary | ICD-10-CM | POA: Insufficient documentation

## 2013-05-19 DIAGNOSIS — S239XXA Sprain of unspecified parts of thorax, initial encounter: Secondary | ICD-10-CM | POA: Insufficient documentation

## 2013-05-19 MED ORDER — CYCLOBENZAPRINE HCL 10 MG PO TABS
10.0000 mg | ORAL_TABLET | Freq: Once | ORAL | Status: AC
  Administered 2013-05-19: 10 mg via ORAL
  Filled 2013-05-19: qty 1

## 2013-05-19 MED ORDER — IBUPROFEN 400 MG PO TABS
800.0000 mg | ORAL_TABLET | Freq: Once | ORAL | Status: AC
  Administered 2013-05-19: 800 mg via ORAL
  Filled 2013-05-19: qty 2
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
## 2013-05-20 MED ORDER — IBUPROFEN 800 MG PO TABS: 800.0000 mg | ORAL_TABLET | Freq: Four times a day (QID) | ORAL | Status: DC | PRN

## 2013-05-20 MED ORDER — CYCLOBENZAPRINE HCL 10 MG PO TABS: 10.0000 mg | ORAL_TABLET | Freq: Three times a day (TID) | ORAL | Status: DC | PRN

## 2013-05-20 NOTE — ED Provider Notes (Signed)
Medical screening examination/treatment/procedure(s) were performed by non-physician practitioner and as supervising physician I was immediately available for consultation/collaboration.   Megan E Docherty, MD 05/20/13 1329 

## 2013-05-20 NOTE — Discharge Instructions (Signed)
Back Pain, Adult °Low back pain is very common. About 1 in 5 people have back pain. The cause of low back pain is rarely dangerous. The pain often gets better over time. About half of people with a sudden onset of back pain feel better in just 2 weeks. About 8 in 10 people feel better by 6 weeks.  °CAUSES °Some common causes of back pain include: °· Strain of the muscles or ligaments supporting the spine. °· Wear and tear (degeneration) of the spinal discs. °· Arthritis. °· Direct injury to the back. °DIAGNOSIS °Most of the time, the direct cause of low back pain is not known. However, back pain can be treated effectively even when the exact cause of the pain is unknown. Answering your caregiver's questions about your overall health and symptoms is one of the most accurate ways to make sure the cause of your pain is not dangerous. If your caregiver needs more information, he or she may order lab work or imaging tests (X-rays or MRIs). However, even if imaging tests show changes in your back, this usually does not require surgery. °HOME CARE INSTRUCTIONS °For many people, back pain returns. Since low back pain is rarely dangerous, it is often a condition that people can learn to manage on their own.  °· Remain active. It is stressful on the back to sit or stand in one place. Do not sit, drive, or stand in one place for more than 30 minutes at a time. Take short walks on level surfaces as soon as pain allows. Try to increase the length of time you walk each day. °· Do not stay in bed. Resting more than 1 or 2 days can delay your recovery. °· Do not avoid exercise or work. Your body is made to move. It is not dangerous to be active, even though your back may hurt. Your back will likely heal faster if you return to being active before your pain is gone. °· Pay attention to your body when you  bend and lift. Many people have less discomfort when lifting if they bend their knees, keep the load close to their bodies, and  avoid twisting. Often, the most comfortable positions are those that put less stress on your recovering back. °· Find a comfortable position to sleep. Use a firm mattress and lie on your side with your knees slightly bent. If you lie on your back, put a pillow under your knees. °· Only take over-the-counter or prescription medicines as directed by your caregiver. Over-the-counter medicines to reduce pain and inflammation are often the most helpful. Your caregiver may prescribe muscle relaxant drugs. These medicines help dull your pain so you can more quickly return to your normal activities and healthy exercise. °· Put ice on the injured area. °· Put ice in a plastic bag. °· Place a towel between your skin and the bag. °· Leave the ice on for 15-20 minutes, 03-04 times a day for the first 2 to 3 days. After that, ice and heat may be alternated to reduce pain and spasms. °· Ask your caregiver about trying back exercises and gentle massage. This may be of some benefit. °· Avoid feeling anxious or stressed. Stress increases muscle tension and can worsen back pain. It is important to recognize when you are anxious or stressed and learn ways to manage it. Exercise is a great option. °SEEK MEDICAL CARE IF: °· You have pain that is not relieved with rest or medicine. °· You have pain that does not improve in 1 week. °· You have new symptoms. °· You are generally not feeling well. °SEEK   IMMEDIATE MEDICAL CARE IF:  °· You have pain that radiates from your back into your legs. °· You develop new bowel or bladder control problems. °· You have unusual weakness or numbness in your arms or legs. °· You develop nausea or vomiting. °· You develop abdominal pain. °· You feel faint. °Document Released: 03/10/2005 Document Revised: 09/09/2011 Document Reviewed: 07/29/2010 °ExitCare® Patient Information ©2014 [employer]. ° °Lumbosacral Strain °Lumbosacral strain is a strain of any of the parts that make up your lumbosacral  vertebrae. Your lumbosacral vertebrae are the bones that make up the lower third of your backbone. Your lumbosacral vertebrae are held together by muscles and tough, fibrous tissue (ligaments).  °CAUSES  °A sudden blow to your back can cause lumbosacral strain. Also, anything that causes an excessive stretch of the muscles in the low back can cause this strain. This is typically seen when people exert themselves strenuously, fall, lift heavy objects, bend, or crouch repeatedly. °RISK FACTORS °· Physically demanding work. °· Participation in pushing or pulling sports or sports that require sudden twist of the back (tennis, golf, baseball). °· Weight lifting. °· Excessive lower back curvature. °· Forward-tilted pelvis. °· Weak back or abdominal muscles or both. °· Tight hamstrings. °SIGNS AND SYMPTOMS  °Lumbosacral strain may cause pain in the area of your injury or pain that moves (radiates) down your leg.  °DIAGNOSIS °Your health care provider can often diagnose lumbosacral strain through a physical exam. In some cases, you may need tests such as X-ray exams.  °TREATMENT  °Treatment for your lower back injury depends on many factors that your clinician will have to evaluate. However, most treatment will include the use of anti-inflammatory medicines. °HOME CARE INSTRUCTIONS  °· Avoid hard physical activities (tennis, racquetball, waterskiing) if you are not in proper physical condition for it. This may aggravate or create problems. °· If you have a back problem, avoid sports requiring sudden body movements. Swimming and walking are generally safer activities. °· Maintain good posture. °· Maintain a healthy weight. °· For acute conditions, you may put ice on the injured area. °· Put ice in a plastic bag. °· Place a towel between your skin and the bag. °· Leave the ice on for 20 minutes, 2 3 times a day. °· When the low back starts healing, stretching and strengthening exercises may be recommended. °SEEK MEDICAL CARE  IF: °· Your back pain is getting worse. °· You experience severe back pain not relieved with medicines. °SEEK IMMEDIATE MEDICAL CARE IF:  °· You have numbness, tingling, weakness, or problems with the use of your arms or legs. °· There is a change in bowel or bladder control. °· You have increasing pain in any area of the body, including your belly (abdomen). °· You notice shortness of breath, dizziness, or feel faint. °· You feel sick to your stomach (nauseous), are throwing up (vomiting), or become sweaty. °· You notice discoloration of your toes or legs, or your feet get very cold. °MAKE SURE YOU:  °· Understand these instructions. °· Will watch your condition. °· Will get help right away if you are not doing well or get worse. °Document Released: 12/18/2004 Document Revised: 12/29/2012 Document Reviewed: 10/27/2012 °ExitCare® Patient Information ©2014 [employer]. ° °Thoracic Strain °You have injured the muscles or tendons that attach to the upper part of your back behind your chest. This injury is called a thoracic strain, thoracic sprain, or mid-back strain.  °CAUSES  °The cause of thoracic strain varies. A less   severe injury involves pulling a muscle or tendon without tearing it. A more severe injury involves tearing (rupturing) a muscle or tendon. With less severe injuries, there may be little loss of strength. Sometimes, there are breaks (fractures) in the bones to which the muscles are attached. These fractures are rare, unless there was a direct hit (trauma) or you have weak bones due to osteoporosis or age. Longstanding strains may be caused by overuse or improper form during certain movements. Obesity can also increase your risk for back injuries. Sudden strains may occur due to injury or not warming up properly before exercise. Often, there is no obvious cause for a thoracic strain. °SYMPTOMS  °The main symptom is pain, especially with movement, such as during exercise. °DIAGNOSIS  °Your caregiver  can usually tell what is wrong by taking an X-ray and doing a physical exam. °TREATMENT  °· Physical therapy may be helpful for recovery. Your caregiver can give you exercises to do or refer you to a physical therapist after your pain improves. °· After your pain improves, strengthening and conditioning programs appropriate for your sport or occupation may be helpful. °· Always warm up before physical activities or athletics. Stretching after physical activity may also help. °· Certain over-the-counter medicines may also help. Ask your caregiver if there are medicines that would help you. °If this is your first thoracic strain injury, proper care and proper healing time before starting activities should prevent long-term problems. Torn ligaments and tendons require as long to heal as broken bones. Average healing times may be only 1 week for a mild strain. For torn muscles and tendons, healing time may be up to 6 weeks to 2 months. °HOME CARE INSTRUCTIONS  °· Apply ice to the injured area. Ice massages may also be used as directed. °· Put ice in a plastic bag. °· Place a towel between your skin and the bag. °· Leave the ice on for 15-20 minutes, 03-04 times a day, for the first 2 days. °· Only take over-the-counter or prescription medicines for pain, discomfort, or fever as directed by your caregiver. °· Keep your appointments for physical therapy if this was prescribed. °· Use wraps and back braces as instructed. °SEEK IMMEDIATE MEDICAL CARE IF:  °· You have an increase in bruising, swelling, or pain. °· Your pain has not improved with medicines. °· You develop new shortness of breath, chest pain, or fever. °· Problems seem to be getting worse rather than better. °MAKE SURE YOU:  °· Understand these instructions. °· Will watch your condition. °· Will get help right away if you are not doing well or get worse. °Document Released: 05/31/2003 Document Revised: 06/02/2011 Document Reviewed: 04/26/2010 °ExitCare®  Patient Information ©2014 [employer]. ° °

## 2013-06-10 ENCOUNTER — Ambulatory Visit: Payer: Self-pay | Admitting: Internal Medicine

## 2014-01-06 ENCOUNTER — Other Ambulatory Visit: Payer: Self-pay

## 2014-01-23 ENCOUNTER — Encounter (HOSPITAL_COMMUNITY): Payer: Self-pay | Admitting: Emergency Medicine

## 2014-06-07 ENCOUNTER — Other Ambulatory Visit: Payer: Self-pay | Admitting: Physician Assistant

## 2014-06-07 ENCOUNTER — Other Ambulatory Visit (HOSPITAL_COMMUNITY)
Admission: RE | Admit: 2014-06-07 | Discharge: 2014-06-07 | Disposition: A | Discharge status: Home / Self Care | Payer: 59 | Source: Ambulatory Visit | Attending: Physician Assistant | Admitting: Physician Assistant

## 2014-06-07 DIAGNOSIS — Z1151 Encounter for screening for human papillomavirus (HPV): Secondary | ICD-10-CM | POA: Diagnosis present

## 2014-06-07 DIAGNOSIS — R87810 Cervical high risk human papillomavirus (HPV) DNA test positive: Secondary | ICD-10-CM | POA: Insufficient documentation

## 2014-06-07 DIAGNOSIS — Z124 Encounter for screening for malignant neoplasm of cervix: Secondary | ICD-10-CM | POA: Diagnosis not present

## 2014-06-08 LAB — CYTOLOGY - PAP

## 2014-06-22 ENCOUNTER — Other Ambulatory Visit: Payer: Self-pay | Admitting: Obstetrics & Gynecology

## 2014-07-18 ENCOUNTER — Emergency Department (HOSPITAL_COMMUNITY)
Admission: EM | Admit: 2014-07-18 | Discharge: 2014-07-18 | Disposition: A | Discharge status: Home / Self Care | Payer: 59 | Attending: Emergency Medicine | Admitting: Emergency Medicine

## 2014-07-18 ENCOUNTER — Encounter (HOSPITAL_COMMUNITY): Payer: Self-pay | Admitting: *Deleted

## 2014-07-18 ENCOUNTER — Emergency Department (HOSPITAL_COMMUNITY): Payer: 59

## 2014-07-18 DIAGNOSIS — Z8619 Personal history of other infectious and parasitic diseases: Secondary | ICD-10-CM | POA: Insufficient documentation

## 2014-07-18 DIAGNOSIS — R509 Fever, unspecified: Secondary | ICD-10-CM | POA: Insufficient documentation

## 2014-07-18 DIAGNOSIS — J029 Acute pharyngitis, unspecified: Secondary | ICD-10-CM | POA: Insufficient documentation

## 2014-07-18 DIAGNOSIS — R6889 Other general symptoms and signs: Secondary | ICD-10-CM

## 2014-07-18 DIAGNOSIS — Z79899 Other long term (current) drug therapy: Secondary | ICD-10-CM | POA: Diagnosis not present

## 2014-07-18 DIAGNOSIS — R05 Cough: Secondary | ICD-10-CM | POA: Diagnosis present

## 2014-07-18 DIAGNOSIS — Z3202 Encounter for pregnancy test, result negative: Secondary | ICD-10-CM | POA: Diagnosis not present

## 2014-07-18 DIAGNOSIS — M791 Myalgia: Secondary | ICD-10-CM | POA: Diagnosis not present

## 2014-07-18 LAB — POC URINE PREG, ED: PREG TEST UR: NEGATIVE

## 2014-07-18 MED ORDER — ACETAMINOPHEN 500 MG PO TABS: 500.0000 mg | ORAL_TABLET | Freq: Four times a day (QID) | ORAL | Status: DC | PRN

## 2014-07-18 MED ORDER — GUAIFENESIN 100 MG/5ML PO LIQD: 100.0000 mg | ORAL | Status: DC | PRN

## 2014-07-18 MED ORDER — ACETAMINOPHEN 325 MG PO TABS
650.0000 mg | ORAL_TABLET | Freq: Once | ORAL | Status: AC
  Administered 2014-07-18: 650 mg via ORAL
  Filled 2014-07-18: qty 2

## 2014-07-18 NOTE — ED Notes (Signed)
Declined W/C at D/C and was escorted to lobby by RN. 

## 2014-07-18 NOTE — ED Provider Notes (Signed)
CSN: [id number]     Arrival date & time 07/18/14  2041 History  This chart was scribed for non-physician practitioner, Fayrene HelperBowie Leinani Lisbon, PA-C working with Richardean Canalavid H Yao, MD by Gwenyth Oberatherine Macek, ED scribe. This patient was seen in room TR06C/TR06C and the patient's care was started at 9:48 PM  Chief Complaint  Patient presents with  . Cough   The history is provided by the patient. No language interpreter was used.   HPI Comments: Elizabeth [name] is a 34 y.o. female who presents to the Emergency Department complaining of intermittent, moderate, productive cough with clear sputum that started 27 hours ago. She states pleuritic CP that occurs with cough, sore throat, fever of 101, rhinorrhea, chills and generalized body aches as associated symptoms. Pt has tried Robitussin with some relief. She has contact with co-workers who have similar symptoms. Pt is on the depo-provera shot. She denies recent surgery, long travel and a history of PE/DVT. Pt also denies ear pain, HA, SOB, nausea, vomiting and diarrhea as associated symptoms.  Past Medical History  Diagnosis Date  . Trichomonal infection   . History of IBS    Past Surgical History  Procedure Laterality Date  . Cesarean section     History reviewed. No pertinent family history. History  Substance Use Topics  . Smoking status: Never Smoker   . Smokeless tobacco: Not on file  . Alcohol Use: No   OB History    Gravida Para Term Preterm AB TAB SAB Ectopic Multiple Living   1 1 1       1      Review of Systems  Constitutional: Positive for fever and chills.  HENT: Positive for rhinorrhea and sore throat. Negative for ear pain.   Respiratory: Positive for cough.   Gastrointestinal: Negative for nausea, vomiting and diarrhea.  Musculoskeletal: Positive for myalgias.  Neurological: Negative for headaches.      Allergies  Review of patient's allergies indicates no known allergies.  Home Medications   Prior to Admission medications    Medication Sig Start Date End Date Taking? Authorizing Provider  cyclobenzaprine (FLEXERIL) 10 MG tablet Take 1 tablet (10 mg total) by mouth 3 (three) times daily as needed for muscle spasms. 05/20/13   Cherrie DistanceFrances Sanford, PA-C  diphenhydramine-acetaminophen (TYLENOL PM) 25-500 MG TABS Take 1 tablet by mouth at bedtime as needed (for sleep).    Historical Provider, MD  ibuprofen (ADVIL,MOTRIN) 800 MG tablet Take 1 tablet (800 mg total) by mouth every 6 (six) hours as needed for moderate pain. 05/20/13   Cherrie DistanceFrances Sanford, PA-C   BP 117/80 mmHg  Pulse 110  Temp(Src) 101 F (38.3 C) (Oral)  Resp 20  Ht 5\' 4"  (1.626 m)  Wt 143 lb 6.4 oz (65.046 kg)  BMI 24.60 kg/m2  SpO2 98% Physical Exam  Constitutional: She appears well-developed and well-nourished. No distress.  HENT:  Head: Normocephalic and atraumatic.  Right Ear: External ear normal.  Left Ear: External ear normal.  Mouth/Throat: Oropharynx is clear and moist. No oropharyngeal exudate.  Rhinorrhea  Eyes: Conjunctivae and EOM are normal. Pupils are equal, round, and reactive to light.  Neck: Neck supple. No tracheal deviation present.  Cardiovascular: Normal rate, regular rhythm and normal heart sounds.   Pulmonary/Chest: Effort normal and breath sounds normal. No respiratory distress.  Skin: Skin is warm and dry.  Psychiatric: She has a normal mood and affect. Her behavior is normal.  Nursing note and vitals reviewed.   ED Course  Procedures  DIAGNOSTIC STUDIES: Oxygen Saturation is 98% on RA, normal by my interpretation.    COORDINATION OF CARE: 9:53 PM Discussed treatment plan with pt which includes a chest x-ray. Pt agreed to plan.  10:27 PM Pt with flu-like sxs.  cxr neg for pna.  Pt does have fever, tylenol given.  Guaifenesin for sxs control.  tamiflu discussed, pt decline treatment as it is costly and provide minimal relief.  She's stsable for discharge.  Doubt acute emergent medical condition.    Labs Review Labs  Reviewed  POC URINE PREG, ED    Imaging Review Dg Chest 2 View  07/18/2014   CLINICAL DATA:  34 year old female with central chest pains and cough  EXAM: CHEST  2 VIEW  COMPARISON:  Prior chest x-ray 06/01/2009  FINDINGS: The lungs are clear and negative for focal airspace consolidation, pulmonary edema or suspicious pulmonary nodule. No pleural effusion or pneumothorax. Cardiac and mediastinal contours are within normal limits. No acute fracture or lytic or blastic osseous lesions. The visualized upper abdominal bowel gas pattern is unremarkable.  IMPRESSION: No active cardiopulmonary disease.   Electronically Signed   By: Malachy Moan M.D.   On: 07/18/2014 21:57     EKG Interpretation None      MDM   Final diagnoses:  Flu-like symptoms    BP 117/80 mmHg  Pulse 110  Temp(Src) 101 F (38.3 C) (Oral)  Resp 20  Ht  (1.626 m)  Wt 143 lb 6.4 oz (65.046 kg)  BMI 24.60 kg/m2  SpO2 98%   BP 117/80 mmHg  Pulse 110  Temp(Src) 101 F (38.3 C) (Oral)  Resp 20  Ht  (1.626 m)  Wt 143 lb 6.4 oz (65.046 kg)  BMI 24.60 kg/m2  SpO2 98%  I personally performed the services described in this documentation, which was scribed in my presence. The recorded information has been reviewed and is accurate.     Fayrene Helper, PA-C 07/19/14 0013  Richardean Canal, MD 07/20/14 [phone]

## 2014-07-18 NOTE — ED Notes (Signed)
Pt in c/o cough and congestion for the last day, also chills and body aches, fever noted on arrival, took ibuprofen around 6pm, no distress noted, denies n/v/d

## 2014-07-18 NOTE — Discharge Instructions (Signed)

## 2014-12-01 ENCOUNTER — Emergency Department (HOSPITAL_COMMUNITY)
Admission: EM | Admit: 2014-12-01 | Discharge: 2014-12-01 | Disposition: A | Discharge status: Home / Self Care | Payer: 59 | Source: Home / Self Care | Attending: Emergency Medicine | Admitting: Emergency Medicine

## 2014-12-01 ENCOUNTER — Encounter (HOSPITAL_COMMUNITY): Payer: Self-pay | Admitting: *Deleted

## 2014-12-01 DIAGNOSIS — M609 Myositis, unspecified: Secondary | ICD-10-CM

## 2014-12-01 DIAGNOSIS — M791 Myalgia, unspecified site: Secondary | ICD-10-CM

## 2014-12-01 DIAGNOSIS — R202 Paresthesia of skin: Secondary | ICD-10-CM

## 2014-12-01 DIAGNOSIS — M7711 Lateral epicondylitis, right elbow: Secondary | ICD-10-CM | POA: Diagnosis not present

## 2014-12-01 MED ORDER — DICLOFENAC SODIUM 1 % TD GEL: 1.0000 "application " | Freq: Four times a day (QID) | TRANSDERMAL | Status: DC

## 2014-12-01 NOTE — Discharge Instructions (Signed)
Lateral Epicondylitis (Tennis Elbow) with Rehab Lateral epicondylitis involves inflammation and pain around the outer portion of the elbow. The pain is caused by inflammation of the tendons in the forearm that bring back (extend) the wrist. Lateral epicondylitis is also called tennis elbow, because it is very common in tennis players. However, it may occur in any individual who extends the wrist repetitively. If lateral epicondylitis is left untreated, it may become a chronic problem. SYMPTOMS   Pain, tenderness, and inflammation on the outer (lateral) side of the elbow.  Pain or weakness with gripping activities.  Pain that increases with wrist-twisting motions (playing tennis, using a screwdriver, opening a door or a jar).  Pain with lifting objects, including a coffee cup. CAUSES  Lateral epicondylitis is caused by inflammation of the tendons that extend the wrist. Causes of injury may include:  Repetitive stress and strain on the muscles and tendons that extend the wrist.  Sudden change in activity level or intensity.  Incorrect grip in racquet sports.  Incorrect grip size of racquet (often too large).  Incorrect hitting position or technique (usually backhand, leading with the elbow).  Using a racket that is too heavy. RISK INCREASES WITH:  Sports or occupations that require repetitive and/or strenuous forearm and wrist movements (tennis, squash, racquetball, carpentry).  Poor wrist and forearm strength and flexibility.  Failure to warm up properly before activity.  Resuming activity before healing, rehabilitation, and conditioning are complete. PREVENTION   Warm up and stretch properly before activity.  Maintain physical fitness:  Strength, flexibility, and endurance.  Cardiovascular fitness.  Wear and use properly fitted equipment.  Learn and use proper technique and have a coach correct improper technique.  Wear a tennis elbow (counterforce) brace. PROGNOSIS    The course of this condition depends on the degree of the injury. If treated properly, acute cases (symptoms lasting less than 4 weeks) are often resolved in 2 to 6 weeks. Chronic (longer lasting cases) often resolve in 3 to 6 months but may require physical therapy. RELATED COMPLICATIONS   Frequently recurring symptoms, resulting in a chronic problem. Properly treating the problem the first time decreases frequency of recurrence.  Chronic inflammation, scarring tendon degeneration, and partial tendon tear, requiring surgery.  Delayed healing or resolution of symptoms. TREATMENT  Treatment first involves the use of ice and medicine to reduce pain and inflammation. Strengthening and stretching exercises may help reduce discomfort if performed regularly. These exercises may be performed at home if the condition is an acute injury. Chronic cases may require a referral to a physical therapist for evaluation and treatment. Your caregiver may advise a corticosteroid injection to help reduce inflammation. Rarely, surgery is needed. MEDICATION  If pain medicine is needed, nonsteroidal anti-inflammatory medicines (aspirin and ibuprofen), or other minor pain relievers (acetaminophen), are often advised.  Do not take pain medicine for 7 days before surgery.  Prescription pain relievers may be given, if your caregiver thinks they are needed. Use only as directed and only as much as you need.  Corticosteroid injections may be recommended. These injections should be reserved only for the most severe cases, because they can only be given a certain number of times. HEAT AND COLD  Cold treatment (icing) should be applied for 10 to 15 minutes every 2 to 3 hours for inflammation and pain, and immediately after activity that aggravates your symptoms. Use ice packs or an ice massage.  Heat treatment may be used before performing stretching and strengthening activities prescribed by your  caregiver, physical  therapist, or athletic trainer. Use a heat pack or a warm water soak. SEEK MEDICAL CARE IF: Symptoms get worse or do not improve in 2 weeks, despite treatment. EXERCISES  RANGE OF MOTION (ROM) AND STRETCHING EXERCISES - Epicondylitis, Lateral (Tennis Elbow) These exercises may help you when beginning to rehabilitate your injury. Your symptoms may go away with or without further involvement from your physician, physical therapist, or athletic trainer. While completing these exercises, remember:   Restoring tissue flexibility helps normal motion to return to the joints. This allows healthier, less painful movement and activity.  An effective stretch should be held for at least 30 seconds.  A stretch should never be painful. You should only feel a gentle lengthening or release in the stretched tissue. RANGE OF MOTION - Wrist Flexion, Active-Assisted  Extend your right / left elbow with your fingers pointing down.*  Gently pull the back of your hand towards you, until you feel a gentle stretch on the top of your forearm.  Hold this position for __________ seconds. Repeat __________ times. Complete this exercise __________ times per day.  *If directed by your physician, physical therapist or athletic trainer, complete this stretch with your elbow bent, rather than extended. RANGE OF MOTION - Wrist Extension, Active-Assisted  Extend your right / left elbow and turn your palm upwards.*  Gently pull your palm and fingertips back, so your wrist extends and your fingers point more toward the ground.  You should feel a gentle stretch on the inside of your forearm.  Hold this position for __________ seconds. Repeat __________ times. Complete this exercise __________ times per day. *If directed by your physician, physical therapist or athletic trainer, complete this stretch with your elbow bent, rather than extended. STRETCH - Wrist Flexion  Place the back of your right / left hand on a  tabletop, leaving your elbow slightly bent. Your fingers should point away from your body.  Gently press the back of your hand down onto the table by straightening your elbow. You should feel a stretch on the top of your forearm.  Hold this position for __________ seconds. Repeat __________ times. Complete this stretch __________ times per day.  STRETCH - Wrist Extension   Place your right / left fingertips on a tabletop, leaving your elbow slightly bent. Your fingers should point backwards.  Gently press your fingers and palm down onto the table by straightening your elbow. You should feel a stretch on the inside of your forearm.  Hold this position for __________ seconds. Repeat __________ times. Complete this stretch __________ times per day.  STRENGTHENING EXERCISES - Epicondylitis, Lateral (Tennis Elbow) These exercises may help you when beginning to rehabilitate your injury. They may resolve your symptoms with or without further involvement from your physician, physical therapist, or athletic trainer. While completing these exercises, remember:   Muscles can gain both the endurance and the strength needed for everyday activities through controlled exercises.  Complete these exercises as instructed by your physician, physical therapist or athletic trainer. Increase the resistance and repetitions only as guided.  You may experience muscle soreness or fatigue, but the pain or discomfort you are trying to eliminate should never worsen during these exercises. If this pain does get worse, stop and make sure you are following the directions exactly. If the pain is still present after adjustments, discontinue the exercise until you can discuss the trouble with your caregiver. STRENGTH - Wrist Flexors  Sit with your right / left forearm palm-up  and fully supported on a table or countertop. Your elbow should be resting below the height of your shoulder. Allow your wrist to extend over the edge of  the surface.  Loosely holding a __________ weight, or a piece of rubber exercise band or tubing, slowly curl your hand up toward your forearm.  Hold this position for __________ seconds. Slowly lower the wrist back to the starting position in a controlled manner. Repeat __________ times. Complete this exercise __________ times per day.  STRENGTH - Wrist Extensors  Sit with your right / left forearm palm-down and fully supported on a table or countertop. Your elbow should be resting below the height of your shoulder. Allow your wrist to extend over the edge of the surface.  Loosely holding a __________ weight, or a piece of rubber exercise band or tubing, slowly curl your hand up toward your forearm.  Hold this position for __________ seconds. Slowly lower the wrist back to the starting position in a controlled manner. Repeat __________ times. Complete this exercise __________ times per day.  STRENGTH - Ulnar Deviators  Stand with a ____________________ weight in your right / left hand, or sit while holding a rubber exercise band or tubing, with your healthy arm supported on a table or countertop.  Move your wrist, so that your pinkie travels toward your forearm and your thumb moves away from your forearm.  Hold this position for __________ seconds and then slowly lower the wrist back to the starting position. Repeat __________ times. Complete this exercise __________ times per day STRENGTH - Radial Deviators  Stand with a ____________________ weight in your right / left hand, or sit while holding a rubber exercise band or tubing, with your injured arm supported on a table or countertop.  Raise your hand upward in front of you or pull up on the rubber tubing.  Hold this position for __________ seconds and then slowly lower the wrist back to the starting position. Repeat __________ times. Complete this exercise __________ times per day. STRENGTH - Forearm Supinators   Sit with your  right / left forearm supported on a table, keeping your elbow below shoulder height. Rest your hand over the edge, palm down.  Gently grip a hammer or a soup ladle.  Without moving your elbow, slowly turn your palm and hand upward to a "thumbs-up" position.  Hold this position for __________ seconds. Slowly return to the starting position. Repeat __________ times. Complete this exercise __________ times per day.  STRENGTH - Forearm Pronators   Sit with your right / left forearm supported on a table, keeping your elbow below shoulder height. Rest your hand over the edge, palm up.  Gently grip a hammer or a soup ladle.  Without moving your elbow, slowly turn your palm and hand upward to a "thumbs-up" position.  Hold this position for __________ seconds. Slowly return to the starting position. Repeat __________ times. Complete this exercise __________ times per day.  STRENGTH - Grip  Grasp a tennis ball, a dense sponge, or a large, rolled sock in your hand.  Squeeze as hard as you can, without increasing any pain.  Hold this position for __________ seconds. Release your grip slowly. Repeat __________ times. Complete this exercise __________ times per day.  STRENGTH - Elbow Extensors, Isometric  Stand or sit upright, on a firm surface. Place your right / left arm so that your palm faces your stomach, and it is at the height of your waist.  Place your opposite hand  on the underside of your forearm. Gently push up as your right / left arm resists. Push as hard as you can with both arms, without causing any pain or movement at your right / left elbow. Hold this stationary position for __________ seconds. Gradually release the tension in both arms. Allow your muscles to relax completely before repeating. Document Released: 03/10/2005 Document Revised: 07/25/2013 Document Reviewed: 06/22/2008 [facility] Patient Information 2015 [place], [place]. This information is not intended to replace advice  given to you by your health care provider. Make sure you discuss any questions you have with your health care provider.  Muscle Pain Muscle pain (myalgia) may be caused by many things, including:  Overuse or muscle strain, especially if you are not in shape. This is the most common cause of muscle pain.  Injury.  Bruises.  Viruses, such as the flu.  Infectious diseases.  Fibromyalgia, which is a chronic condition that causes muscle tenderness, fatigue, and headache.  Autoimmune diseases, including lupus.  Certain drugs, including ACE inhibitors and statins. Muscle pain may be mild or severe. In most cases, the pain lasts only a short time and goes away without treatment. To diagnose the cause of your muscle pain, your health care provider will take your medical history. This means he or she will ask you when your muscle pain began and what has been happening. If you have not had muscle pain for very long, your health care provider may want to wait before doing much testing. If your muscle pain has lasted a long time, your health care provider may want to run tests right away. If your health care provider thinks your muscle pain may be caused by illness, you may need to have additional tests to rule out certain conditions.  Treatment for muscle pain depends on the cause. Home care is often enough to relieve muscle pain. Your health care provider may also prescribe anti-inflammatory medicine. HOME CARE INSTRUCTIONS Watch your condition for any changes. The following actions may help to lessen any discomfort you are feeling:  Only take over-the-counter or prescription medicines as directed by your health care provider.  Apply ice to the sore muscle:  Put ice in a plastic bag.  Place a towel between your skin and the bag.  Leave the ice on for 15-20 minutes, 3-4 times a day.  You may alternate applying hot and cold packs to the muscle as directed by your health care provider.  If  overuse is causing your muscle pain, slow down your activities until the pain goes away.  Remember that it is normal to feel some muscle pain after starting a workout program. Muscles that have not been used often will be sore at first.  Do regular, gentle exercises if you are not usually active.  Warm up before exercising to lower your risk of muscle pain.  Do not continue working out if the pain is very bad. Bad pain could mean you have injured a muscle. SEEK MEDICAL CARE IF:  Your muscle pain gets worse, and medicines do not help.  You have muscle pain that lasts longer than 3 days.  You have a rash or fever along with muscle pain.  You have muscle pain after a tick bite.  You have muscle pain while working out, even though you are in good physical condition.  You have redness, soreness, or swelling along with muscle pain.  You have muscle pain after starting a new medicine or changing the dose of a medicine.  SEEK IMMEDIATE MEDICAL CARE IF:  You have trouble breathing.  You have trouble swallowing.  You have muscle pain along with a stiff neck, fever, and vomiting.  You have severe muscle weakness or cannot move part of your body. MAKE SURE YOU:   Understand these instructions.  Will watch your condition.  Will get help right away if you are not doing well or get worse. Document Released: 01/30/2006 Document Revised: 03/15/2013 Document Reviewed: 01/04/2013 [facility] Patient Information 2015 [place], [place]. This information is not intended to replace advice given to you by your health care provider. Make sure you discuss any questions you have with your health care provider.  Myofascial Pain Syndrome Myofascial pain syndrome is a pain disorder. This pain may be felt in the muscles. It may come and go. Myofascial pain syndrome always has trigger or tender points in the muscle that will cause pain when pressed.  CAUSES Myofascial pain may be caused by injuries,  especially auto accidents, or by overuse of certain muscles. Typically the pain is long lasting. It is made worse by overuse of the involved muscles, emotional distress, and by cold, damp weather. Myofascial pain syndrome often develops in patients whose response to stress is an increase in muscle tone, and is seen in greater frequency in patients with pre-existing tension headaches. SYMPTOMS  Myofascial pain syndrome causes a wide variety of symptoms. You may see tight ropy bands of muscle. Problems may also include aching, cramping, burning, numbness, tingling, and other uncomfortable sensations in muscular areas. It most commonly affects the neck, upper back, and shoulder areas. Pain often radiates into the arms and hands.  TREATMENT Treatment includes resting the affected muscular area and applying ice packs to reduce spasm and pain. Trigger point injection, is a valuable initial therapy. This therapy is an injection of local anesthetic directly into the trigger point. Trigger points are often present at the source of pain. Pain relief following injection confirms the diagnosis of myofascial pain syndrome. Fairly vigorous therapy can be carried out during the pain-free period after each injection. Stretching exercises to loosen up the muscles are also useful. Transcutaneous electrical nerve stimulation (TENS) may provide relief from pain. TENS is the use of electric current produced by a device to stimulate the nerves. Ultrasound therapy applied directly over the affected muscle may also provide pain relief. Anti-inflammatory pain medicine can be helpful. Symptoms will gradually improve over a period of weeks to months with proper treatment. HOME CARE INSTRUCTIONS Call your caregiver for follow-up care as recommended.  SEEK MEDICAL CARE IF:  Your pain is severe and not helped with medications. Document Released: 04/17/2004 Document Revised: 06/02/2011 Document Reviewed: 04/26/2010 [facility] Patient  Information 2015 [place], [place]. This information is not intended to replace advice given to you by your health care provider. Make sure you discuss any questions you have with your health care provider.  Paresthesia Paresthesia is a burning or prickling feeling. This feeling can happen in any part of the body. It often happens in the hands, arms, legs, or feet. HOME CARE  Avoid drinking alcohol.  Try massage or needle therapy (acupuncture) to help with your problems.  Keep all doctor visits as told. GET HELP RIGHT AWAY IF:   You feel weak.  You have trouble walking or moving.  You have problems speaking or seeing.  You feel confused.  You cannot control when you poop (bowel movement) or pee (urinate).  You lose feeling (numbness) after an injury.  You pass out (faint).  Your burning or prickling feeling gets worse when you walk.  You have pain, cramps, or feel dizzy.  You have a rash. MAKE SURE YOU:   Understand these instructions.  Will watch your condition.  Will get help right away if you are not doing well or get worse. Document Released: 02/21/2008 Document Revised: 06/02/2011 Document Reviewed: 11/29/2010 [facility] Patient Information 2015 [place], [place]. This information is not intended to replace advice given to you by your health care provider. Make sure you discuss any questions you have with your health care provider.  Repetitive Strain Injuries Repetitive strain injuries (RSIs) result from overuse or misuse of soft tissues including muscles, tendons, or nerves. Tendons are the cord-like structures that attach muscles to bones. RSIs can affect almost any part of the body. However, RSIs are most common in the arms (thumbs, wrists, elbows, shoulders) and legs (ankles, knees). Common medical conditions that are often caused by repetitive strain include carpal tunnel syndrome, tennis or golfer's elbow, bursitis, and tendonitis. If RSIs are treated early, and  therepeated activity is reduced or removed, the severity and length of your problems can usually be reduced. RSIs are also called cumulative trauma disorders (CTD).  CAUSES  Many RSIs occur due to repeating the same activity at work over weeks or months without sufficient rest, such as prolonged typing. RSIs also commonly occur when a hobby or sport is done repeatedly without sufficient rest. RSIs can also occur due to repeated strain or stress on a body part in someone who has one or more risk factors for RSIs. RISK FACTORS Workplace risk factors  Frequent computer use, especially if your workstation is not adjusted for your body type.  Infrequent rest breaks.  Working in a high-pressure environment.  Working at a [employer].  Repeating the same motion, such as frequent typing.  Working in an awkward position or holding the same position for a long time.  Forceful movements such as lifting, pulling, or pushing.  Vibration caused by using power tools.  Working in cold temperatures.  Job stress. Personal risk factors  Poor posture.  Being loose-jointed.  Not exercising regularly.  Being overweight.  Arthritis, diabetes, thyroid problems, or other long-term (chronic)medical conditions.  Vitamin deficiencies.  Keeping your fingernails long.  An unhealthy, stressful, or inactive lifestyle.  Not sleeping well. SYMPTOMS  Symptoms often begin at work but become more noticeable after the repeated stress has ended. For example, you may develop fatigue or soreness in your wrist while typingat work, and at night you may develop numbness and tingling in your fingers. Common symptoms include:   Burning, shooting, or aching pain, especially in the fingers, palms, wrists, forearms, or shoulders.  Tenderness.  Swelling.  Tingling, numbness, or loss of feeling.  Pain with certain activities, such as turning a doorknob or reaching above your head.  Weakness, heaviness, or  loss of coordination in yourhand.  Muscle spasms or tightness. In some cases, symptoms can become so intense that it is difficult to perform everyday tasks. Symptoms that do not improve with rest may indicate a more serious condition.  DIAGNOSIS  Your caregiver may determine the type ofRSI you have based on your medical evaluation and a description of your activities.  TREATMENT  Treatment depends on the severity and type of RSI you have. Your caregiver may recommend rest for the affected body part, medicines, and physical or occupational therapy to reduce pain, swelling, and soreness. Discuss the activities you do repeatedly with your caregiver. Your caregiver  can help you decide whether you need to change your activities. An RSI may take months or years to heal, especially if the affected body part gets insufficient rest. In some cases, such as severe carpal tunnel syndrome, surgery may be recommended. PREVENTION  Talk with your supervisor to make sure you have the proper equipment for your work station.  Maintain good posture at your desk or work station with:  Feet flat on the floor.  Knees directly over the feet, bent at a right angle.  Lower back supported by your chair or a cushion in the curve of your lower back.  Shoulders and arms relaxed and at your sides.  Neck relaxed and not bent forwards or backwards.  Your desk and computer workstation properly adjusted to your body type.  Your chair adjusted so there is no excess pressure on the back of your thighs.  The keyboard resting above your thighs. You should be able to reach the keys with your elbows at your side, bent at a right angle. Your arms should be supported on forearm rests, with your forearms parallel to the ground.  The computer mouse within easy reach.  The monitor directly in front of you, so that your eyes are aligned with the top of the screen. The screen should be about 15 to 25 inches from your  eyes.  While typing, keep your wrist straight, in a neutral position. Move your entire arm when you move your mouse or when typing hard-to-reach keys.  Only use your computer as much as you need to for work. Do not use it during breaks.  Take breaks often from any repeated activity. Alternate with another task which requires you to use different muscles, or rest at least once every hour.  Change positions regularly. If you spend a lot of time sitting, get up, walk around, and stretch.  Do not hold pens or pencils tightly when writing.  Exercise regularly.  Maintain a normal weight.  Eat a diet with plenty of vegetables, whole grains, and fruit.  Get sufficient, restful sleep. HOME CARE INSTRUCTIONS  If your caregiver prescribed medicine to help reduce swelling, take it as directed.  Only take over-the-counter or prescription medicines for pain, discomfort, or fever as directed by your caregiver.  Reduce, and if needed, stopthe activities that are causing your problems until you have no further symptoms.If your symptoms are work-related, you may need to talk to your supervisor about changing your activities.  When symptoms develop, put ice or a cold pack on the aching area.  Put ice in a plastic bag.  Place a towel between your skin and the bag.  Leave the ice on for 15-20 minutes.  If you were given a splint to keep your wrist from bending, wear it as instructed. It is important to wear the splint at night. Use the splint for as long as your caregiver recommends. SEEK MEDICAL CARE IF:  You develop new problems.  Your problems do not get better with medicine. MAKE SURE YOU:  Understand these instructions.  Will watch your condition.  Will get help right away if you are not doing well or get worse. Document Released: 02/28/2002 Document Revised: 09/09/2011 Document Reviewed: 05/01/2011 [facility] Patient Information 2015 [place], [place]. This information is not intended  to replace advice given to you by your health care provider. Make sure you discuss any questions you have with your health care provider.  Tennis Elbow Tennis elbow can happen in any sport or job  where you overuse your elbow. It is caused by doing the same motion over and over. This makes small tears in the forearm muscles. It can also cause muscle redness, soreness, and puffiness (inflammation). HOME CARE  Rest.  Use your other hand or arm that is not affected. Change your grip.  Only take medicine as told by your doctor.  Put ice on the area after activity.  Put ice in a plastic bag.  Place a towel between your skin and the bag.  Leave the ice on for 15-20 minutes, 03-04 times a day.  Wear a splint or sling as told by your doctor. This lets the area rest and heal faster. GET HELP RIGHT AWAY IF: Your pain gets worse or does not improve in 2 weeks even with treatment. MAKE SURE YOU:   Understand these instructions.  Will watch your condition.  Will get help right away if you are not doing well or get worse. Document Released: 08/28/2009 Document Revised: 06/02/2011 Document Reviewed: 08/28/2009 [facility] Patient Information 2015 [place], [place]. This information is not intended to replace advice given to you by your health care provider. Make sure you discuss any questions you have with your health care provider.

## 2014-12-01 NOTE — ED Provider Notes (Signed)
CSN: [id number]     Arrival date & time 12/01/14  1622 History   First MD Initiated Contact with Patient 12/01/14 1647     Chief Complaint  Patient presents with  . Arm Problem   (Consider location/radiation/quality/duration/timing/severity/associated sxs/prior Treatment) HPI Comments: 34 year old female complaining of tingling and numbness in the right upper arm as well as the wrist and fingers. She states this started approximately 4-5 days. 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 This entails frequent use of her home and right hand, wrist and upper extremity. Upon awakening in the morning she has more numbness in her fingers that tends to last for about 15 minutes before improving. Mild pain to areas described above. Denies any known trauma. Her strength is apparently unchanged. She demonstrates 5 over 5 strength during the exam. Denies injury or pain to the neck or head.   Past Medical History  Diagnosis Date  . Trichomonal infection   . History of IBS    Past Surgical History  Procedure Laterality Date  . Cesarean section     History reviewed. No pertinent family history. Social History  Substance Use Topics  . Smoking status: Never Smoker   . Smokeless tobacco: None  . Alcohol Use: No   OB History    Gravida Para Term Preterm AB TAB SAB Ectopic Multiple Living   Review of Systems  Constitutional: Positive for activity change. Negative for fever, chills and fatigue.  HENT: Negative.   Respiratory: Negative.   Cardiovascular: Negative.   Musculoskeletal: Positive for myalgias. Negative for back pain, joint swelling, gait problem and neck pain.       As per HPI  Skin: Negative for color change and pallor.  Neurological: Positive for numbness. Negative for dizziness, facial asymmetry, weakness and headaches.  Psychiatric/Behavioral: Negative.   All other systems reviewed and are negative.   Allergies  Review of  patient's allergies indicates no known allergies.  Home Medications   Prior to Admission medications   Medication Sig Start Date End Date Taking? Authorizing Provider  acetaminophen (TYLENOL) 500 MG tablet Take 1 tablet (500 mg total) by mouth every 6 (six) hours as needed. 07/18/14   Fayrene Helper, PA-C  cyclobenzaprine (FLEXERIL) 10 MG tablet Take 1 tablet (10 mg total) by mouth 3 (three) times daily as needed for muscle spasms. 05/20/13   Cherrie Distance, PA-C  diclofenac sodium (VOLTAREN) 1 % GEL Apply 1 application topically 4 (four) times daily. 12/01/14   Hayden Rasmussen, NP  diphenhydramine-acetaminophen (TYLENOL PM) 25-500 MG TABS Take 1 tablet by mouth at bedtime as needed (for sleep).    Historical Provider, MD  guaiFENesin (ROBITUSSIN) 100 MG/5ML liquid Take 5-10 mLs (100-200 mg total) by mouth every 4 (four) hours as needed for cough. 07/18/14   Fayrene Helper, PA-C  ibuprofen (ADVIL,MOTRIN) 800 MG tablet Take 1 tablet (800 mg total) by mouth every 6 (six) hours as needed for moderate pain. 05/20/13   Cherrie Distance, PA-C   Meds Ordered and Administered this Visit  Medications - No data to display  BP 124/83 mmHg  Pulse 86  Temp(Src) 98.6 F (37 C) (Oral)  Resp 16  SpO2 97% No data found.   Physical Exam  Constitutional: She is oriented to person, place, and time. She appears well-developed and well-nourished. No distress.  HENT:  Head: Normocephalic and atraumatic.  Eyes:  EOM are normal. Pupils are equal, round, and reactive to light.  Neck: Normal range of motion. Neck supple.  Pulmonary/Chest: Effort normal. No respiratory distress.  Musculoskeletal: Normal range of motion. She exhibits tenderness. She exhibits no edema.  We have and the patient to perform Phalen's test she complains of pain to the proximal forearm extensor surface near the elbow as well as the deltoid muscle and trapezoid muscle. These areas are also tender. The Phalen's test is positive for numbness in the fingers.  Negative Tinel's. There is tenderness over the right lateral epicondyles. Tenderness over the forearm extensor muscles. Grip strength and wrist strength is 5 over 5 bilaterally. There is tenderness over the wrist as well. No swelling. No deformity. Range of motion is complete in the right upper extremity including shoulder. Distal vascular, motor and sensory is intact. Radial pulse 2+. Capillary refill is brisk.  Lymphadenopathy:    She has no cervical adenopathy.  Neurological: She is alert and oriented to person, place, and time. No cranial nerve deficit.  Skin: Skin is warm and dry.  Nursing note and vitals reviewed.   ED Course  Procedures (including critical care time)  Labs Review Labs Reviewed - No data to display  Imaging Review No results found.   Visual Acuity Review  Right Eye Distance:   Left Eye Distance:   Bilateral Distance:    Right Eye Near:   Left Eye Near:    Bilateral Near:         MDM   1. Right hand paresthesia   2. Right lateral epicondylitis   3. Myofasciitis   4. Muscle pain    Wear wrist splint while working Ice to wrist, elbow and upper arm frequently Diclofenac gel qid to areas discussed Try to limit use of the Right arm as myuch as possible for next several days.  Possible early right CTS.    Hayden Rasmussen, NP 12/01/14 1739

## 2014-12-01 NOTE — ED Notes (Addendum)
Pt  Reports   Some  Tingling        And  Numb  Sensation  r   Arm  X  4  Days  Reports      No known  Injury          She  Reports  As  Well    Some  Numbness  In  Toes  She  Ambulated  To  Room  With a  Slow  Steady  Gait    She  Is  Sitting  Upright on the  Exam table  Speaking in  Complete   sentances

## 2015-02-08 ENCOUNTER — Encounter: Payer: Self-pay | Admitting: Gastroenterology

## 2015-06-13 ENCOUNTER — Other Ambulatory Visit: Payer: Self-pay | Admitting: Physician Assistant

## 2015-06-13 ENCOUNTER — Other Ambulatory Visit (HOSPITAL_COMMUNITY)
Admission: RE | Admit: 2015-06-13 | Discharge: 2015-06-13 | Disposition: A | Discharge status: Home / Self Care | Payer: BLUE CROSS/BLUE SHIELD | Source: Ambulatory Visit | Attending: Physician Assistant | Admitting: Physician Assistant

## 2015-06-13 DIAGNOSIS — Z1151 Encounter for screening for human papillomavirus (HPV): Secondary | ICD-10-CM | POA: Insufficient documentation

## 2015-06-13 DIAGNOSIS — Z01411 Encounter for gynecological examination (general) (routine) with abnormal findings: Secondary | ICD-10-CM | POA: Diagnosis present

## 2015-06-14 LAB — CYTOLOGY - PAP

## 2015-07-25 ENCOUNTER — Emergency Department (HOSPITAL_COMMUNITY)
Admission: EM | Admit: 2015-07-25 | Discharge: 2015-07-26 | Disposition: A | Discharge status: Home / Self Care | Payer: BLUE CROSS/BLUE SHIELD | Attending: Emergency Medicine | Admitting: Emergency Medicine

## 2015-07-25 ENCOUNTER — Encounter (HOSPITAL_COMMUNITY): Payer: Self-pay | Admitting: Emergency Medicine

## 2015-07-25 ENCOUNTER — Emergency Department (HOSPITAL_COMMUNITY): Payer: BLUE CROSS/BLUE SHIELD

## 2015-07-25 DIAGNOSIS — R079 Chest pain, unspecified: Secondary | ICD-10-CM | POA: Diagnosis not present

## 2015-07-25 DIAGNOSIS — R51 Headache: Secondary | ICD-10-CM | POA: Insufficient documentation

## 2015-07-25 DIAGNOSIS — Z8719 Personal history of other diseases of the digestive system: Secondary | ICD-10-CM | POA: Insufficient documentation

## 2015-07-25 DIAGNOSIS — Z309 Encounter for contraceptive management, unspecified: Secondary | ICD-10-CM | POA: Diagnosis not present

## 2015-07-25 DIAGNOSIS — Z8619 Personal history of other infectious and parasitic diseases: Secondary | ICD-10-CM | POA: Insufficient documentation

## 2015-07-25 DIAGNOSIS — R519 Headache, unspecified: Secondary | ICD-10-CM

## 2015-07-25 LAB — CBC
HCT: 41.7 % (ref 36.0–46.0)
HEMOGLOBIN: 13.7 g/dL (ref 12.0–15.0)
MCH: 27.5 pg (ref 26.0–34.0)
MCHC: 32.9 g/dL (ref 30.0–36.0)
MCV: 83.7 fL (ref 78.0–100.0)
PLATELETS: 203 10*3/uL (ref 150–400)
RBC: 4.98 MIL/uL (ref 3.87–5.11)
RDW: 13.7 % (ref 11.5–15.5)
WBC: 7.3 10*3/uL (ref 4.0–10.5)

## 2015-07-25 LAB — BASIC METABOLIC PANEL
Anion gap: 12 (ref 5–15)
BUN: 14 mg/dL (ref 6–20)
CALCIUM: 9.6 mg/dL (ref 8.9–10.3)
CO2: 23 mmol/L (ref 22–32)
CREATININE: 0.98 mg/dL (ref 0.44–1.00)
Chloride: 106 mmol/L (ref 101–111)
GFR calc Af Amer: >60 mL/min (ref >60)
GFR calc non Af Amer: >60 mL/min (ref >60)
GLUCOSE: 105 mg/dL — AB (ref 65–99)
Potassium: 3.3 mmol/L — ABNORMAL LOW (ref 3.5–5.1)
Sodium: 141 mmol/L (ref 135–145)

## 2015-07-25 LAB — I-STAT TROPONIN, ED: TROPONIN I, POC: 0 ng/mL (ref 0.00–0.08)

## 2015-07-25 NOTE — ED Provider Notes (Signed)
CSN: [id number]     Arrival date & time 07/25/15  2130 History  By signing my name below, I, Bethel Born, attest that this documentation has been prepared under the direction and in the presence of Geoffery Lyons, MD. Electronically Signed: Bethel Born, ED Scribe. 07/26/2015. 12:01 AM   Chief Complaint  Patient presents with  . Chest Pain  . Headache    The history is provided by the patient. No language interpreter was used.   Elizabeth [name] is a 35 y.o. female who presents to the Emergency Department complaining of a new, constant, left posterior headache with onset 4-5 days ago. The pain occasionally radiates to the left temple.  OTC pain medication has provided insufficient relief in pain at home. She denies any recent head trauma and has no history of migraines.  Associated symptoms include light headedness and an episode of feeling hot,  nausea, racing palpitations, left-sided chest pain, and generalized weakness tonight. Pt denies vision change, SOB, abdominal pain, sweating. No history of cardiac disease, HTN, DM, or high cholesterol. No significant family history of cardiac disease. She does not smoke.   Past Medical History  Diagnosis Date  . Trichomonal infection   . History of IBS    Past Surgical History  Procedure Laterality Date  . Cesarean section     No family history on file. Social History  Substance Use Topics  . Smoking status: Never Smoker   . Smokeless tobacco: None  . Alcohol Use: No   OB History    Gravida Para Term Preterm AB TAB SAB Ectopic Multiple Living   Review of Systems  10 Systems reviewed and all are negative for acute change except as noted in the HPI.  Allergies  Review of patient's allergies indicates no known allergies.  Home Medications   Prior to Admission medications   Medication Sig Start Date End Date Taking? Authorizing Provider  acetaminophen (TYLENOL) 500 MG tablet Take 1 tablet (500 mg total) by  mouth every 6 (six) hours as needed. Patient not taking: Reported on 07/26/2015 07/18/14   Fayrene Helper, PA-C  cyclobenzaprine (FLEXERIL) 10 MG tablet Take 1 tablet (10 mg total) by mouth 3 (three) times daily as needed for muscle spasms. Patient not taking: Reported on 07/26/2015 05/20/13   Cherrie Distance, PA-C  diclofenac sodium (VOLTAREN) 1 % GEL Apply 1 application topically 4 (four) times daily. Patient not taking: Reported on 07/26/2015 12/01/14   Hayden Rasmussen, NP  guaiFENesin (ROBITUSSIN) 100 MG/5ML liquid Take 5-10 mLs (100-200 mg total) by mouth every 4 (four) hours as needed for cough. Patient not taking: Reported on 07/26/2015 07/18/14   Fayrene Helper, PA-C  ibuprofen (ADVIL,MOTRIN) 800 MG tablet Take 1 tablet (800 mg total) by mouth every 6 (six) hours as needed for moderate pain. Patient not taking: Reported on 07/26/2015 05/20/13   Cherrie Distance, PA-C   BP 125/86 mmHg  Pulse 82  Temp(Src) 98.3 F (36.8 C) (Oral)  Resp 18  SpO2 100% Physical Exam  Constitutional: She is oriented to person, place, and time. She appears well-developed and well-nourished. No distress.  HENT:  Head: Normocephalic and atraumatic.  Eyes: EOM are normal. Pupils are equal, round, and reactive to light.  Neck: Normal range of motion.  Cardiovascular: Normal rate, regular rhythm and normal heart sounds.   No murmur heard. Pulmonary/Chest: Effort normal and breath sounds normal. No respiratory distress. She has no wheezes.  Abdominal: Soft. She exhibits no distension. There is no tenderness.  Musculoskeletal: Normal range of motion.  Neurological: She is alert and oriented to person, place, and time. No cranial nerve deficit. She exhibits normal muscle tone. Coordination normal.  Skin: Skin is warm and dry.  Psychiatric: She has a normal mood and affect. Judgment normal.  Nursing note and vitals reviewed.   ED Course  Procedures (including critical care time) DIAGNOSTIC STUDIES: Oxygen Saturation is 100% on RA,   normal by my interpretation.    COORDINATION OF CARE: 11:57 PM Discussed treatment plan which includes lab work, CXR, EKG with pt at bedside and pt agreed to plan.  Labs Review Labs Reviewed  BASIC METABOLIC PANEL - Abnormal; Notable for the following:    Potassium 3.3 (*)    Glucose, Bld 105 (*)    All other components within normal limits  CBC  I-STAT TROPOININ, ED    Imaging Review Dg Chest 2 View  07/25/2015  CLINICAL DATA:  Left-sided chest pain, onset today. EXAM: CHEST  2 VIEW COMPARISON:  07/18/2014 FINDINGS: The lungs are clear. The pulmonary vasculature is normal. Heart size is normal. Hilar and mediastinal contours are unremarkable. There is no pleural effusion. IMPRESSION: No active cardiopulmonary disease. Electronically Signed   By: Ellery Plunkaniel R Mitchell M.D.   On: 07/25/2015 21:59   Ct Head Wo Contrast  07/26/2015  CLINICAL DATA:  Headache for 1 week with nausea. EXAM: CT HEAD WITHOUT CONTRAST TECHNIQUE: Contiguous axial images were obtained from the base of the skull through the vertex without intravenous contrast. COMPARISON:  05/29/2009 FINDINGS: There is no intracranial hemorrhage, mass or evidence of acute infarction. There is no extra-axial fluid collection. Gray matter and white matter appear normal. Cerebral volume is normal for age. Brainstem and posterior fossa are unremarkable. The CSF spaces appear normal. The bony structures are intact. The visible portions of the paranasal sinuses are clear. The orbits are unremarkable. IMPRESSION: Normal brain Electronically Signed   By: Ellery Plunkaniel R Mitchell M.D.   On: 07/26/2015 01:34   I have personally reviewed and evaluated these images and lab results as part of my medical decision-making.   EKG Interpretation   Date/Time:  Wednesday Jul 25 2015 21:37:45 EDT Ventricular Rate:  76 PR Interval:  156 QRS Duration: 78 QT Interval:  372 QTC Calculation: 418 R Axis:   97 Text Interpretation:  Sinus rhythm Anterolateral infarct ,  age  undetermined Abnormal ECG Confirmed by Daneka Lantigua  MD, Ailee Pates ([id number]) on  07/25/2015 11:41:26 PM      MDM   Final diagnoses:  None    Patient presents with complaints of headache and chest pain. Neither of these appear to have an emergent cause. Her EKG and troponin is negative and CT scan of her head is unremarkable. She will be discharged with rotating Tylenol and ibuprofen and advised to follow-up with her primary Dr. if not improving.  I personally performed the services described in this documentation, which was scribed in my presence. The recorded information has been reviewed and is accurate.       Geoffery Lyonsouglas Jaegar Croft, MD 07/26/15 [phone]

## 2015-07-25 NOTE — ED Notes (Signed)
Pt c/o headache x1 week with nausea, states at work today started having substernal CP. Pt denies Vomting/diarrhea. No neuro deficits, ambulatory. Pt pain 9/10, respirations e/u.

## 2015-07-26 ENCOUNTER — Emergency Department (HOSPITAL_COMMUNITY): Payer: BLUE CROSS/BLUE SHIELD

## 2015-07-26 ENCOUNTER — Encounter (HOSPITAL_COMMUNITY): Payer: Self-pay | Admitting: Radiology

## 2015-07-26 DIAGNOSIS — R51 Headache: Secondary | ICD-10-CM | POA: Diagnosis not present

## 2015-07-26 MED ORDER — KETOROLAC TROMETHAMINE 60 MG/2ML IM SOLN
60.0000 mg | Freq: Once | INTRAMUSCULAR | Status: AC
  Administered 2015-07-26: 60 mg via INTRAMUSCULAR
  Filled 2015-07-26: qty 2

## 2015-07-26 NOTE — Discharge Instructions (Signed)
Tylenol 1000 mg rotated with ibuprofen 600 mg every 4 hours as needed for pain.  Return to the emergency department if your symptoms significantly worsen or change, and follow-up with your primary Dr. if not improving in the next week.   General Headache Without Cause A headache is pain or discomfort felt around the head or neck area. The specific cause of a headache may not be found. There are many causes and types of headaches. A few common ones are:  Tension headaches.  Migraine headaches.  Cluster headaches.  Chronic daily headaches. HOME CARE INSTRUCTIONS  Watch your condition for any changes. Take these steps to help with your condition: Managing Pain  Take over-the-counter and prescription medicines only as told by your health care provider.  Lie down in a dark, quiet room when you have a headache.  If directed, apply ice to the head and neck area:  Put ice in a plastic bag.  Place a towel between your skin and the bag.  Leave the ice on for 20 minutes, 2-3 times per day.  Use a heating pad or hot shower to apply heat to the head and neck area as told by your health care provider.  Keep lights dim if bright lights bother you or make your headaches worse. Eating and Drinking  Eat meals on a regular schedule.  Limit alcohol use.  Decrease the amount of caffeine you drink, or stop drinking caffeine. General Instructions  Keep all follow-up visits as told by your health care provider. This is important.  Keep a headache journal to help find out what may trigger your headaches. For example, write down:  What you eat and drink.  How much sleep you get.  Any change to your diet or medicines.  Try massage or other relaxation techniques.  Limit stress.  Sit up straight, and do not tense your muscles.  Do not use tobacco products, including cigarettes, chewing tobacco, or e-cigarettes. If you need help quitting, ask your health care provider.  Exercise  regularly as told by your health care provider.  Sleep on a regular schedule. Get 7-9 hours of sleep, or the amount recommended by your health care provider. SEEK MEDICAL CARE IF:   Your symptoms are not helped by medicine.  You have a headache that is different from the usual headache.  You have nausea or you vomit.  You have a fever. SEEK IMMEDIATE MEDICAL CARE IF:   Your headache becomes severe.  You have repeated vomiting.  You have a stiff neck.  You have a loss of vision.  You have problems with speech.  You have pain in the eye or ear.  You have muscular weakness or loss of muscle control.  You lose your balance or have trouble walking.  You feel faint or pass out.  You have confusion.   This information is not intended to replace advice given to you by your health care provider. Make sure you discuss any questions you have with your health care provider.   Document Released: 03/10/2005 Document Revised: 11/29/2014 Document Reviewed: 07/03/2014 Elsevier Interactive Patient Education 2016 [employer]  Nonspecific Chest Pain  Chest pain can be caused by many different conditions. There is always a chance that your pain could be related to something serious, such as a heart attack or a blood clot in your lungs. Chest pain can also be caused by conditions that are not life-threatening. If you have chest pain, it is very important to follow up with  your health care provider. CAUSES  Chest pain can be caused by:  Heartburn.  Pneumonia or bronchitis.  Anxiety or stress.  Inflammation around your heart (pericarditis) or lung (pleuritis or pleurisy).  A blood clot in your lung.  A collapsed lung (pneumothorax). It can develop suddenly on its own (spontaneous pneumothorax) or from trauma to the chest.  Shingles infection (varicella-zoster virus).  Heart attack.  Damage to the bones, muscles, and cartilage that make up your chest wall. This can  include:  Bruised bones due to injury.  Strained muscles or cartilage due to frequent or repeated coughing or overwork.  Fracture to one or more ribs.  Sore cartilage due to inflammation (costochondritis). RISK FACTORS  Risk factors for chest pain may include:  Activities that increase your risk for trauma or injury to your chest.  Respiratory infections or conditions that cause frequent coughing.  Medical conditions or overeating that can cause heartburn.  Heart disease or family history of heart disease.  Conditions or health behaviors that increase your risk of developing a blood clot.  Having had chicken pox (varicella zoster). SIGNS AND SYMPTOMS Chest pain can feel like:  Burning or tingling on the surface of your chest or deep in your chest.  Crushing, pressure, aching, or squeezing pain.  Dull or sharp pain that is worse when you move, cough, or take a deep breath.  Pain that is also felt in your back, neck, shoulder, or arm, or pain that spreads to any of these areas. Your chest pain may come and go, or it may stay constant. DIAGNOSIS Lab tests or other studies may be needed to find the cause of your pain. Your health care provider may have you take a test called an ambulatory ECG (electrocardiogram). An ECG records your heartbeat patterns at the time the test is performed. You may also have other tests, such as:  Transthoracic echocardiogram (TTE). During echocardiography, sound waves are used to create a picture of all of the heart structures and to look at how blood flows through your heart.  Transesophageal echocardiogram (TEE).This is a more advanced imaging test that obtains images from inside your body. It allows your health care provider to see your heart in finer detail.  Cardiac monitoring. This allows your health care provider to monitor your heart rate and rhythm in real time.  Holter monitor. This is a portable device that records your heartbeat and  can help to diagnose abnormal heartbeats. It allows your health care provider to track your heart activity for several days, if needed.  Stress tests. These can be done through exercise or by taking medicine that makes your heart beat more quickly.  Blood tests.  Imaging tests. TREATMENT  Your treatment depends on what is causing your chest pain. Treatment may include:  Medicines. These may include:  Acid blockers for heartburn.  Anti-inflammatory medicine.  Pain medicine for inflammatory conditions.  Antibiotic medicine, if an infection is present.  Medicines to dissolve blood clots.  Medicines to treat coronary artery disease.  Supportive care for conditions that do not require medicines. This may include:  Resting.  Applying heat or cold packs to injured areas.  Limiting activities until pain decreases. HOME CARE INSTRUCTIONS  If you were prescribed an antibiotic medicine, finish it all even if you start to feel better.  Avoid any activities that bring on chest pain.  Do not use any tobacco products, including cigarettes, chewing tobacco, or electronic cigarettes. If you need help quitting, ask your  health care provider.  Do not drink alcohol.  Take medicines only as directed by your health care provider.  Keep all follow-up visits as directed by your health care provider. This is important. This includes any further testing if your chest pain does not go away.  If heartburn is the cause for your chest pain, you may be told to keep your head raised (elevated) while sleeping. This reduces the chance that acid will go from your stomach into your esophagus.  Make lifestyle changes as directed by your health care provider. These may include:  Getting regular exercise. Ask your health care provider to suggest some activities that are safe for you.  Eating a heart-healthy diet. A registered dietitian can help you to learn healthy eating options.  Maintaining a  healthy weight.  Managing diabetes, if necessary.  Reducing stress. SEEK MEDICAL CARE IF:  Your chest pain does not go away after treatment.  You have a rash with blisters on your chest.  You have a fever. SEEK IMMEDIATE MEDICAL CARE IF:   Your chest pain is worse.  You have an increasing cough, or you cough up blood.  You have severe abdominal pain.  You have severe weakness.  You faint.  You have chills.  You have sudden, unexplained chest discomfort.  You have sudden, unexplained discomfort in your arms, back, neck, or jaw.  You have shortness of breath at any time.  You suddenly start to sweat, or your skin gets clammy.  You feel nauseous or you vomit.  You suddenly feel light-headed or dizzy.  Your heart begins to beat quickly, or it feels like it is skipping beats. These symptoms may represent a serious problem that is an emergency. Do not wait to see if the symptoms will go away. Get medical help right away. Call your local emergency services (911 in the [place] Do not drive yourself to the hospital.   This information is not intended to replace advice given to you by your health care provider. Make sure you discuss any questions you have with your health care provider.   Document Released: 12/18/2004 Document Revised: 03/31/2014 Document Reviewed: 10/14/2013 Elsevier Interactive Patient Education [employer]

## 2015-07-26 NOTE — ED Notes (Signed)
Pt back from CT via stretcher. No signs of acute distress.

## 2015-07-26 NOTE — ED Notes (Signed)
Patient transported to CT 

## 2015-08-02 DIAGNOSIS — R51 Headache: Secondary | ICD-10-CM | POA: Diagnosis not present

## 2015-10-16 DIAGNOSIS — Z309 Encounter for contraceptive management, unspecified: Secondary | ICD-10-CM | POA: Diagnosis not present

## 2015-10-18 DIAGNOSIS — N898 Other specified noninflammatory disorders of vagina: Secondary | ICD-10-CM | POA: Diagnosis not present

## 2015-12-17 DIAGNOSIS — M5412 Radiculopathy, cervical region: Secondary | ICD-10-CM | POA: Diagnosis not present

## 2015-12-17 DIAGNOSIS — M542 Cervicalgia: Secondary | ICD-10-CM | POA: Diagnosis not present

## 2015-12-17 DIAGNOSIS — G5601 Carpal tunnel syndrome, right upper limb: Secondary | ICD-10-CM | POA: Diagnosis not present

## 2015-12-17 DIAGNOSIS — M436 Torticollis: Secondary | ICD-10-CM | POA: Diagnosis not present

## 2015-12-31 DIAGNOSIS — M542 Cervicalgia: Secondary | ICD-10-CM | POA: Diagnosis not present

## 2016-01-02 DIAGNOSIS — M542 Cervicalgia: Secondary | ICD-10-CM | POA: Diagnosis not present

## 2016-01-07 DIAGNOSIS — Z309 Encounter for contraceptive management, unspecified: Secondary | ICD-10-CM | POA: Diagnosis not present

## 2016-01-08 DIAGNOSIS — M542 Cervicalgia: Secondary | ICD-10-CM | POA: Diagnosis not present

## 2016-01-10 DIAGNOSIS — M542 Cervicalgia: Secondary | ICD-10-CM | POA: Diagnosis not present

## 2016-01-14 DIAGNOSIS — M542 Cervicalgia: Secondary | ICD-10-CM | POA: Diagnosis not present

## 2016-01-15 DIAGNOSIS — M436 Torticollis: Secondary | ICD-10-CM | POA: Diagnosis not present

## 2016-01-15 DIAGNOSIS — M958 Other specified acquired deformities of musculoskeletal system: Secondary | ICD-10-CM | POA: Diagnosis not present

## 2016-01-15 DIAGNOSIS — M542 Cervicalgia: Secondary | ICD-10-CM | POA: Diagnosis not present

## 2016-01-15 DIAGNOSIS — M5412 Radiculopathy, cervical region: Secondary | ICD-10-CM | POA: Diagnosis not present

## 2016-01-16 DIAGNOSIS — N898 Other specified noninflammatory disorders of vagina: Secondary | ICD-10-CM | POA: Diagnosis not present

## 2016-01-17 DIAGNOSIS — M542 Cervicalgia: Secondary | ICD-10-CM | POA: Diagnosis not present

## 2016-01-22 DIAGNOSIS — M542 Cervicalgia: Secondary | ICD-10-CM | POA: Diagnosis not present

## 2016-01-24 DIAGNOSIS — M542 Cervicalgia: Secondary | ICD-10-CM | POA: Diagnosis not present

## 2016-01-31 DIAGNOSIS — M542 Cervicalgia: Secondary | ICD-10-CM | POA: Diagnosis not present

## 2016-02-05 DIAGNOSIS — M542 Cervicalgia: Secondary | ICD-10-CM | POA: Diagnosis not present

## 2016-02-07 DIAGNOSIS — M542 Cervicalgia: Secondary | ICD-10-CM | POA: Diagnosis not present

## 2016-02-11 DIAGNOSIS — M542 Cervicalgia: Secondary | ICD-10-CM | POA: Diagnosis not present

## 2016-02-13 DIAGNOSIS — M542 Cervicalgia: Secondary | ICD-10-CM | POA: Diagnosis not present

## 2016-02-13 DIAGNOSIS — K625 Hemorrhage of anus and rectum: Secondary | ICD-10-CM | POA: Diagnosis not present

## 2016-02-18 DIAGNOSIS — M542 Cervicalgia: Secondary | ICD-10-CM | POA: Diagnosis not present

## 2016-03-03 DIAGNOSIS — R300 Dysuria: Secondary | ICD-10-CM | POA: Diagnosis not present

## 2016-03-03 DIAGNOSIS — N760 Acute vaginitis: Secondary | ICD-10-CM | POA: Diagnosis not present

## 2016-03-10 DIAGNOSIS — R8299 Other abnormal findings in urine: Secondary | ICD-10-CM | POA: Diagnosis not present

## 2016-03-21 ENCOUNTER — Other Ambulatory Visit: Payer: Self-pay | Admitting: Physician Assistant

## 2016-03-21 ENCOUNTER — Ambulatory Visit
Admission: RE | Admit: 2016-03-21 | Discharge: 2016-03-21 | Disposition: A | Discharge status: Home / Self Care | Payer: BLUE CROSS/BLUE SHIELD | Source: Ambulatory Visit | Attending: Physician Assistant | Admitting: Physician Assistant

## 2016-03-21 DIAGNOSIS — K5900 Constipation, unspecified: Secondary | ICD-10-CM | POA: Diagnosis not present

## 2016-03-21 DIAGNOSIS — R109 Unspecified abdominal pain: Secondary | ICD-10-CM | POA: Diagnosis not present

## 2016-03-31 DIAGNOSIS — Z309 Encounter for contraceptive management, unspecified: Secondary | ICD-10-CM | POA: Diagnosis not present

## 2016-06-19 ENCOUNTER — Other Ambulatory Visit (HOSPITAL_COMMUNITY)
Admission: RE | Admit: 2016-06-19 | Discharge: 2016-06-19 | Disposition: A | Discharge status: Home / Self Care | Payer: BLUE CROSS/BLUE SHIELD | Source: Ambulatory Visit | Attending: Family Medicine | Admitting: Family Medicine

## 2016-06-19 ENCOUNTER — Other Ambulatory Visit: Payer: Self-pay | Admitting: Physician Assistant

## 2016-06-19 DIAGNOSIS — Z0000 Encounter for general adult medical examination without abnormal findings: Secondary | ICD-10-CM | POA: Diagnosis not present

## 2016-06-19 DIAGNOSIS — Z124 Encounter for screening for malignant neoplasm of cervix: Secondary | ICD-10-CM | POA: Insufficient documentation

## 2016-06-23 DIAGNOSIS — Z309 Encounter for contraceptive management, unspecified: Secondary | ICD-10-CM | POA: Diagnosis not present

## 2016-06-24 LAB — CYTOLOGY - PAP: Diagnosis: NEGATIVE

## 2016-08-15 DIAGNOSIS — R109 Unspecified abdominal pain: Secondary | ICD-10-CM | POA: Diagnosis not present

## 2016-09-12 DIAGNOSIS — Z3042 Encounter for surveillance of injectable contraceptive: Secondary | ICD-10-CM | POA: Diagnosis not present

## 2016-12-05 DIAGNOSIS — Z3042 Encounter for surveillance of injectable contraceptive: Secondary | ICD-10-CM | POA: Diagnosis not present

## 2016-12-05 DIAGNOSIS — Z309 Encounter for contraceptive management, unspecified: Secondary | ICD-10-CM | POA: Diagnosis not present

## 2017-01-29 DIAGNOSIS — N898 Other specified noninflammatory disorders of vagina: Secondary | ICD-10-CM | POA: Diagnosis not present

## 2017-02-26 DIAGNOSIS — Z3042 Encounter for surveillance of injectable contraceptive: Secondary | ICD-10-CM | POA: Diagnosis not present

## 2017-03-05 DIAGNOSIS — R1032 Left lower quadrant pain: Secondary | ICD-10-CM | POA: Diagnosis not present

## 2017-03-05 DIAGNOSIS — M549 Dorsalgia, unspecified: Secondary | ICD-10-CM | POA: Diagnosis not present

## 2017-04-11 DIAGNOSIS — G5601 Carpal tunnel syndrome, right upper limb: Secondary | ICD-10-CM | POA: Diagnosis not present

## 2017-04-28 DIAGNOSIS — R202 Paresthesia of skin: Secondary | ICD-10-CM | POA: Diagnosis not present

## 2017-04-28 DIAGNOSIS — M25531 Pain in right wrist: Secondary | ICD-10-CM | POA: Diagnosis not present

## 2017-05-20 DIAGNOSIS — M25531 Pain in right wrist: Secondary | ICD-10-CM | POA: Diagnosis not present

## 2017-05-20 DIAGNOSIS — Z3042 Encounter for surveillance of injectable contraceptive: Secondary | ICD-10-CM | POA: Diagnosis not present

## 2017-06-29 DIAGNOSIS — J069 Acute upper respiratory infection, unspecified: Secondary | ICD-10-CM | POA: Diagnosis not present

## 2017-07-20 DIAGNOSIS — N898 Other specified noninflammatory disorders of vagina: Secondary | ICD-10-CM | POA: Diagnosis not present

## 2017-07-20 DIAGNOSIS — M5417 Radiculopathy, lumbosacral region: Secondary | ICD-10-CM | POA: Insufficient documentation

## 2017-07-20 DIAGNOSIS — M545 Low back pain: Secondary | ICD-10-CM | POA: Diagnosis not present

## 2017-07-23 DIAGNOSIS — R319 Hematuria, unspecified: Secondary | ICD-10-CM | POA: Diagnosis not present

## 2017-09-15 DIAGNOSIS — Z0000 Encounter for general adult medical examination without abnormal findings: Secondary | ICD-10-CM | POA: Diagnosis not present

## 2017-09-15 DIAGNOSIS — Z136 Encounter for screening for cardiovascular disorders: Secondary | ICD-10-CM | POA: Diagnosis not present

## 2017-10-28 IMAGING — CR DG ABDOMEN 1V
1 series · 1 of 1 positions shown · non-contrast
Comparison: 05/19/2013

CLINICAL DATA: Abdominal pain, bloating, constipation

EXAM:
ABDOMEN - 1 VIEW

[t abdomen supine]
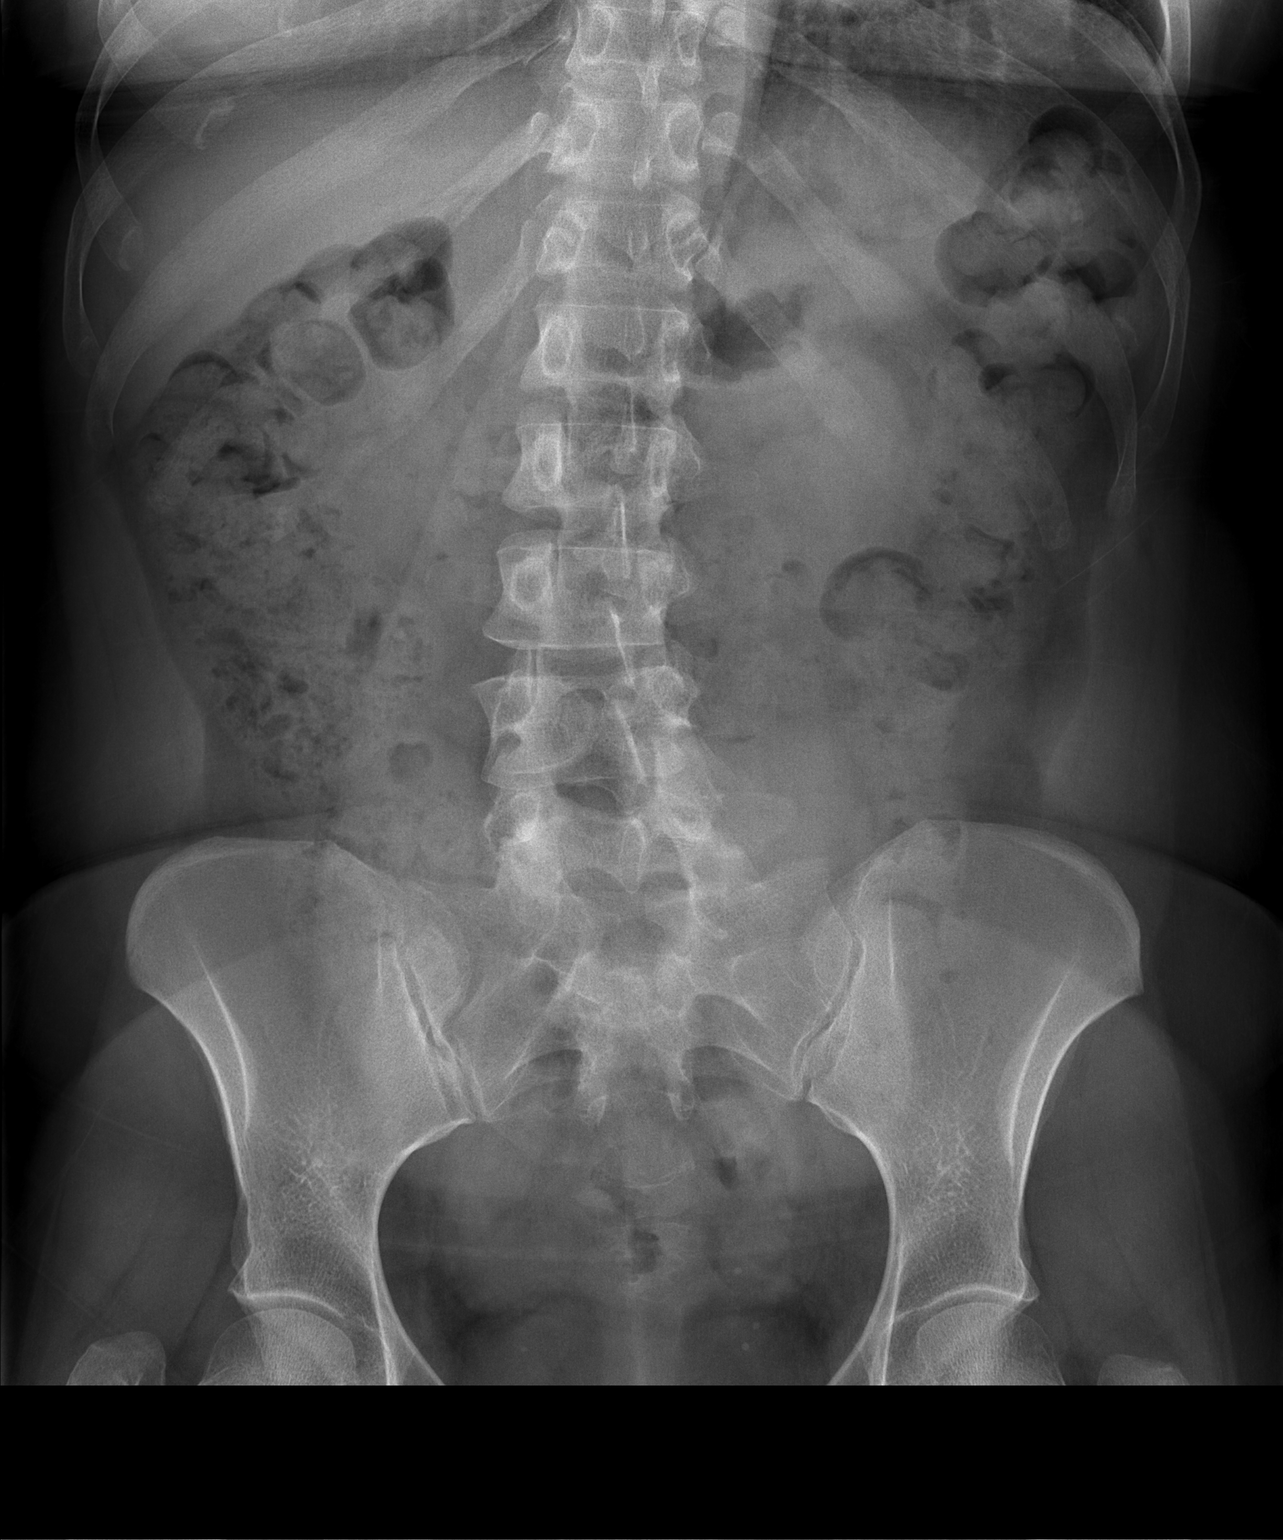

[1 of 1 positions shown; findings below may reference images not displayed]

FINDINGS: There is normal small bowel gas pattern. Moderate stool noted in
right colon transverse colon and descending colon.
IMPRESSION: Normal small bowel gas pattern. Moderate stool in right colon
transverse colon and descending colon.

## 2017-11-04 DIAGNOSIS — Z3042 Encounter for surveillance of injectable contraceptive: Secondary | ICD-10-CM | POA: Diagnosis not present

## 2017-11-26 ENCOUNTER — Ambulatory Visit
Admission: RE | Admit: 2017-11-26 | Discharge: 2017-11-26 | Disposition: A | Discharge status: Home / Self Care | Payer: BLUE CROSS/BLUE SHIELD | Source: Ambulatory Visit | Attending: Family Medicine | Admitting: Family Medicine

## 2017-11-26 ENCOUNTER — Other Ambulatory Visit: Payer: Self-pay | Admitting: Family Medicine

## 2017-11-26 DIAGNOSIS — R109 Unspecified abdominal pain: Secondary | ICD-10-CM | POA: Diagnosis not present

## 2017-11-26 DIAGNOSIS — M5440 Lumbago with sciatica, unspecified side: Secondary | ICD-10-CM

## 2017-11-26 DIAGNOSIS — M545 Low back pain: Secondary | ICD-10-CM | POA: Diagnosis not present

## 2017-12-07 DIAGNOSIS — M545 Low back pain: Secondary | ICD-10-CM | POA: Diagnosis not present

## 2017-12-07 DIAGNOSIS — M5417 Radiculopathy, lumbosacral region: Secondary | ICD-10-CM | POA: Diagnosis not present

## 2017-12-16 ENCOUNTER — Other Ambulatory Visit: Payer: Self-pay | Admitting: Sports Medicine

## 2017-12-16 DIAGNOSIS — M5417 Radiculopathy, lumbosacral region: Secondary | ICD-10-CM

## 2017-12-18 DIAGNOSIS — M5417 Radiculopathy, lumbosacral region: Secondary | ICD-10-CM | POA: Diagnosis not present

## 2017-12-21 ENCOUNTER — Ambulatory Visit
Admission: RE | Admit: 2017-12-21 | Discharge: 2017-12-21 | Disposition: A | Discharge status: Home / Self Care | Payer: BLUE CROSS/BLUE SHIELD | Source: Ambulatory Visit | Attending: Sports Medicine | Admitting: Sports Medicine

## 2017-12-21 DIAGNOSIS — M48061 Spinal stenosis, lumbar region without neurogenic claudication: Secondary | ICD-10-CM | POA: Diagnosis not present

## 2017-12-21 DIAGNOSIS — M5417 Radiculopathy, lumbosacral region: Secondary | ICD-10-CM

## 2017-12-24 DIAGNOSIS — R300 Dysuria: Secondary | ICD-10-CM | POA: Diagnosis not present

## 2017-12-24 DIAGNOSIS — Z23 Encounter for immunization: Secondary | ICD-10-CM | POA: Diagnosis not present

## 2017-12-24 DIAGNOSIS — R1032 Left lower quadrant pain: Secondary | ICD-10-CM | POA: Diagnosis not present

## 2017-12-25 DIAGNOSIS — M5417 Radiculopathy, lumbosacral region: Secondary | ICD-10-CM | POA: Diagnosis not present

## 2017-12-28 DIAGNOSIS — M5417 Radiculopathy, lumbosacral region: Secondary | ICD-10-CM | POA: Diagnosis not present

## 2017-12-28 DIAGNOSIS — M545 Low back pain: Secondary | ICD-10-CM | POA: Diagnosis not present

## 2017-12-30 DIAGNOSIS — R1032 Left lower quadrant pain: Secondary | ICD-10-CM | POA: Diagnosis not present

## 2017-12-30 DIAGNOSIS — D259 Leiomyoma of uterus, unspecified: Secondary | ICD-10-CM | POA: Diagnosis not present

## 2018-01-07 DIAGNOSIS — K5900 Constipation, unspecified: Secondary | ICD-10-CM | POA: Diagnosis not present

## 2018-01-07 DIAGNOSIS — M545 Low back pain: Secondary | ICD-10-CM | POA: Diagnosis not present

## 2018-01-11 DIAGNOSIS — M5416 Radiculopathy, lumbar region: Secondary | ICD-10-CM | POA: Diagnosis not present

## 2018-01-26 DIAGNOSIS — Z3042 Encounter for surveillance of injectable contraceptive: Secondary | ICD-10-CM | POA: Diagnosis not present

## 2018-02-02 DIAGNOSIS — M51369 Other intervertebral disc degeneration, lumbar region without mention of lumbar back pain or lower extremity pain: Secondary | ICD-10-CM | POA: Insufficient documentation

## 2018-02-02 DIAGNOSIS — M5136 Other intervertebral disc degeneration, lumbar region: Secondary | ICD-10-CM | POA: Diagnosis not present

## 2018-02-05 DIAGNOSIS — M79669 Pain in unspecified lower leg: Secondary | ICD-10-CM | POA: Diagnosis not present

## 2018-02-09 DIAGNOSIS — R1032 Left lower quadrant pain: Secondary | ICD-10-CM | POA: Diagnosis not present

## 2018-02-09 DIAGNOSIS — K5900 Constipation, unspecified: Secondary | ICD-10-CM | POA: Diagnosis not present

## 2018-02-12 DIAGNOSIS — M545 Low back pain: Secondary | ICD-10-CM | POA: Diagnosis not present

## 2018-02-16 DIAGNOSIS — M545 Low back pain: Secondary | ICD-10-CM | POA: Diagnosis not present

## 2018-02-23 ENCOUNTER — Other Ambulatory Visit: Payer: Self-pay | Admitting: Physician Assistant

## 2018-02-23 DIAGNOSIS — R1032 Left lower quadrant pain: Secondary | ICD-10-CM

## 2018-02-23 DIAGNOSIS — M545 Low back pain: Secondary | ICD-10-CM | POA: Diagnosis not present

## 2018-02-25 DIAGNOSIS — M545 Low back pain: Secondary | ICD-10-CM | POA: Diagnosis not present

## 2018-02-26 ENCOUNTER — Ambulatory Visit
Admission: RE | Admit: 2018-02-26 | Discharge: 2018-02-26 | Disposition: A | Discharge status: Home / Self Care | Payer: BLUE CROSS/BLUE SHIELD | Source: Ambulatory Visit | Attending: Physician Assistant | Admitting: Physician Assistant

## 2018-02-26 DIAGNOSIS — R1032 Left lower quadrant pain: Secondary | ICD-10-CM

## 2018-02-26 DIAGNOSIS — N3289 Other specified disorders of bladder: Secondary | ICD-10-CM | POA: Diagnosis not present

## 2018-02-26 MED ORDER — IOPAMIDOL (ISOVUE-300) INJECTION 61%
100.0000 mL | Freq: Once | INTRAVENOUS | Status: AC | PRN
Start: 2018-02-26 — End: 2018-02-26
  Administered 2018-02-26: 100 mL via INTRAVENOUS

## 2018-03-02 DIAGNOSIS — M545 Low back pain: Secondary | ICD-10-CM | POA: Diagnosis not present

## 2018-03-04 DIAGNOSIS — M545 Low back pain: Secondary | ICD-10-CM | POA: Diagnosis not present

## 2018-03-08 DIAGNOSIS — M5136 Other intervertebral disc degeneration, lumbar region: Secondary | ICD-10-CM | POA: Diagnosis not present

## 2018-03-10 DIAGNOSIS — R351 Nocturia: Secondary | ICD-10-CM | POA: Diagnosis not present

## 2018-03-10 DIAGNOSIS — R350 Frequency of micturition: Secondary | ICD-10-CM | POA: Diagnosis not present

## 2018-03-26 DIAGNOSIS — M5136 Other intervertebral disc degeneration, lumbar region: Secondary | ICD-10-CM | POA: Diagnosis not present

## 2018-03-26 DIAGNOSIS — M5417 Radiculopathy, lumbosacral region: Secondary | ICD-10-CM | POA: Diagnosis not present

## 2018-04-08 DIAGNOSIS — R8290 Unspecified abnormal findings in urine: Secondary | ICD-10-CM | POA: Diagnosis not present

## 2018-04-08 DIAGNOSIS — K5900 Constipation, unspecified: Secondary | ICD-10-CM | POA: Diagnosis not present

## 2018-04-09 DIAGNOSIS — R350 Frequency of micturition: Secondary | ICD-10-CM | POA: Diagnosis not present

## 2018-04-09 DIAGNOSIS — R300 Dysuria: Secondary | ICD-10-CM | POA: Diagnosis not present

## 2018-04-13 DIAGNOSIS — M5136 Other intervertebral disc degeneration, lumbar region: Secondary | ICD-10-CM | POA: Diagnosis not present

## 2018-04-13 DIAGNOSIS — M5417 Radiculopathy, lumbosacral region: Secondary | ICD-10-CM | POA: Diagnosis not present

## 2018-04-19 DIAGNOSIS — Z3042 Encounter for surveillance of injectable contraceptive: Secondary | ICD-10-CM | POA: Diagnosis not present

## 2018-04-29 DIAGNOSIS — M791 Myalgia, unspecified site: Secondary | ICD-10-CM | POA: Diagnosis not present

## 2018-04-29 DIAGNOSIS — M5416 Radiculopathy, lumbar region: Secondary | ICD-10-CM | POA: Diagnosis not present

## 2018-05-10 DIAGNOSIS — J069 Acute upper respiratory infection, unspecified: Secondary | ICD-10-CM | POA: Diagnosis not present

## 2018-05-10 DIAGNOSIS — R05 Cough: Secondary | ICD-10-CM | POA: Diagnosis not present

## 2018-05-11 ENCOUNTER — Emergency Department (HOSPITAL_COMMUNITY)
Admission: EM | Admit: 2018-05-11 | Discharge: 2018-05-11 | Disposition: A | Discharge status: Home / Self Care | Payer: BLUE CROSS/BLUE SHIELD | Attending: Emergency Medicine | Admitting: Emergency Medicine

## 2018-05-11 ENCOUNTER — Other Ambulatory Visit: Payer: Self-pay

## 2018-05-11 ENCOUNTER — Encounter (HOSPITAL_COMMUNITY): Payer: Self-pay | Admitting: Emergency Medicine

## 2018-05-11 DIAGNOSIS — J029 Acute pharyngitis, unspecified: Secondary | ICD-10-CM | POA: Diagnosis not present

## 2018-05-11 DIAGNOSIS — R509 Fever, unspecified: Secondary | ICD-10-CM | POA: Diagnosis not present

## 2018-05-11 LAB — GROUP A STREP BY PCR: Group A Strep by PCR: NOT DETECTED

## 2018-05-11 MED ORDER — ACETAMINOPHEN 325 MG PO TABS
650.0000 mg | ORAL_TABLET | Freq: Once | ORAL | Status: AC
  Administered 2018-05-11: 650 mg via ORAL
  Filled 2018-05-11: qty 2

## 2018-05-11 MED ORDER — DEXAMETHASONE 4 MG PO TABS
12.0000 mg | ORAL_TABLET | Freq: Once | ORAL | Status: AC
  Administered 2018-05-11: 12 mg via ORAL
  Filled 2018-05-11: qty 3

## 2018-05-11 NOTE — ED Triage Notes (Signed)
Pt. Stated, I started having a sore throat and fever started last  a week ago. Pt. Went to [facility] on [address] and was given antibiotic and swabbed my throat and it was negative . I came here for the same just making sure. Given Amocicillin

## 2018-05-13 DIAGNOSIS — J069 Acute upper respiratory infection, unspecified: Secondary | ICD-10-CM | POA: Diagnosis not present

## 2018-05-13 NOTE — ED Provider Notes (Signed)
[facility] EMERGENCY DEPARTMENT Provider Note   CSN: [id number] Arrival date & time: 05/11/18  1722    History   Chief Complaint Chief Complaint  Patient presents with  . Sore Throat  . Fever    HPI Elizabeth [name] is a 38 y.o. female.     HPI 37yF with fever and sore throat. Onset a few days ago and persistent since. She says she was seen at urgent care today and was flu and strep negative but was still given abx. She wanted another opinion and felt like fever was higher so came to ED.   Past Medical History:  Diagnosis Date  . History of IBS   . Trichomonal infection     Patient Active Problem List   Diagnosis Date Noted  . Depo contraception 12/10/2012  . LLQ abdominal pain 10/15/2012  . Vaginal discharge 02/06/2011  . HEADACHE 05/02/2010  . INSOMNIA 06/04/2009    Past Surgical History:  Procedure Laterality Date  . CESAREAN SECTION       OB History    Gravida  1   Para  1   Term  1   Preterm      AB      Living  1     SAB      TAB      Ectopic      Multiple      Live Births               Home Medications    Prior to Admission medications   Medication Sig Start Date End Date Taking? Authorizing Provider  acetaminophen (TYLENOL) 500 MG tablet Take 1 tablet (500 mg total) by mouth every 6 (six) hours as needed. Patient not taking: Reported on 07/26/2015 07/18/14   Fayrene Helper, PA-C  cyclobenzaprine (FLEXERIL) 10 MG tablet Take 1 tablet (10 mg total) by mouth 3 (three) times daily as needed for muscle spasms. Patient not taking: Reported on 07/26/2015 05/20/13   Cherrie Distance, PA-C  diclofenac sodium (VOLTAREN) 1 % GEL Apply 1 application topically 4 (four) times daily. Patient not taking: Reported on 07/26/2015 12/01/14   Hayden Rasmussen, NP  guaiFENesin (ROBITUSSIN) 100 MG/5ML liquid Take 5-10 mLs (100-200 mg total) by mouth every 4 (four) hours as needed for cough. Patient not taking: Reported on 07/26/2015 07/18/14    Fayrene Helper, PA-C  ibuprofen (ADVIL,MOTRIN) 800 MG tablet Take 1 tablet (800 mg total) by mouth every 6 (six) hours as needed for moderate pain. Patient not taking: Reported on 07/26/2015 05/20/13   Cherrie Distance, PA-C    Family History No family history on file.  Social History Social History   Tobacco Use  . Smoking status: Never Smoker  . Smokeless tobacco: Never Used  Substance Use Topics  . Alcohol use: No  . Drug use: No     Allergies   Patient has no known allergies.   Review of Systems Review of Systems  All systems reviewed and negative, other than as noted in HPI.  Physical Exam Updated Vital Signs BP 104/79 (BP Location: Right Arm)   Pulse (!) 113   Temp 99.3 F (37.4 C) (Oral)   Resp 18   Ht 5\' 4"  (1.626 m)   Wt 77.1 kg   SpO2 100%   BMI 29.18 kg/m   Physical Exam Vitals signs and nursing note reviewed.  Constitutional:      General: She is not in acute distress.    Appearance: She is  well-developed.  HENT:     Head: Normocephalic and atraumatic.     Right Ear: Tympanic membrane normal.     Left Ear: Tympanic membrane normal.     Mouth/Throat:     Pharynx: Posterior oropharyngeal erythema present. No oropharyngeal exudate.     Comments: Uvula midline. Pharyngitis/tonsillitis. No exudate. Tonsils pretty big but not "kissing." Normal sounding voice. Neck supple.  Eyes:     General:        Right eye: No discharge.        Left eye: No discharge.     Conjunctiva/sclera: Conjunctivae normal.  Neck:     Musculoskeletal: Neck supple.  Cardiovascular:     Rate and Rhythm: Regular rhythm. Tachycardia present.     Heart sounds: Normal heart sounds. No murmur. No friction rub. No gallop.   Pulmonary:     Effort: Pulmonary effort is normal. No respiratory distress.     Breath sounds: Normal breath sounds.  Abdominal:     General: There is no distension.     Palpations: Abdomen is soft.     Tenderness: There is no abdominal tenderness.    Musculoskeletal:        General: No tenderness.  Skin:    General: Skin is warm and dry.  Neurological:     Mental Status: She is alert.  Psychiatric:        Behavior: Behavior normal.        Thought Content: Thought content normal.      ED Treatments / Results  Labs (all labs ordered are listed, but only abnormal results are displayed) Labs Reviewed  GROUP A STREP BY PCR    EKG None  Radiology No results found.  Procedures Procedures (including critical care time)  Medications Ordered in ED Medications  acetaminophen (TYLENOL) tablet 650 mg (650 mg Oral Given 05/11/18 1805)  dexamethasone (DECADRON) tablet 12 mg (12 mg Oral Given 05/11/18 2021)     Initial Impression / Assessment and Plan / ED Course  I have reviewed the triage vital signs and the nursing notes.  Pertinent labs & imaging results that were available during my care of the patient were reviewed by me and considered in my medical decision making (see chart for details).  37yF with pharyngitis. Reportedly rapid strep neg but still prescribed augmentin. No evidence of serious deep space infection on exam. Take abx. PRN tylenol or nsaid for fever and aches/pains. Return precautions discussed.   Final Clinical Impressions(s) / ED Diagnoses   Final diagnoses:  Acute pharyngitis, unspecified etiology    ED Discharge Orders    None       Raeford Razor, MD 05/13/18 [phone]

## 2018-05-15 DIAGNOSIS — J029 Acute pharyngitis, unspecified: Secondary | ICD-10-CM | POA: Diagnosis not present

## 2018-05-15 DIAGNOSIS — R05 Cough: Secondary | ICD-10-CM | POA: Diagnosis not present

## 2018-05-17 DIAGNOSIS — N898 Other specified noninflammatory disorders of vagina: Secondary | ICD-10-CM | POA: Diagnosis not present

## 2018-05-17 DIAGNOSIS — Z113 Encounter for screening for infections with a predominantly sexual mode of transmission: Secondary | ICD-10-CM | POA: Diagnosis not present

## 2018-05-27 DIAGNOSIS — M5136 Other intervertebral disc degeneration, lumbar region: Secondary | ICD-10-CM | POA: Diagnosis not present

## 2018-05-27 DIAGNOSIS — M5417 Radiculopathy, lumbosacral region: Secondary | ICD-10-CM | POA: Diagnosis not present

## 2018-06-02 DIAGNOSIS — J019 Acute sinusitis, unspecified: Secondary | ICD-10-CM | POA: Diagnosis not present

## 2018-06-23 DIAGNOSIS — M5417 Radiculopathy, lumbosacral region: Secondary | ICD-10-CM | POA: Diagnosis not present

## 2018-06-23 DIAGNOSIS — G894 Chronic pain syndrome: Secondary | ICD-10-CM | POA: Diagnosis not present

## 2018-06-23 DIAGNOSIS — M545 Low back pain: Secondary | ICD-10-CM | POA: Diagnosis not present

## 2018-06-23 DIAGNOSIS — M5136 Other intervertebral disc degeneration, lumbar region: Secondary | ICD-10-CM | POA: Diagnosis not present

## 2018-07-06 DIAGNOSIS — J309 Allergic rhinitis, unspecified: Secondary | ICD-10-CM | POA: Diagnosis not present

## 2018-07-06 DIAGNOSIS — G4700 Insomnia, unspecified: Secondary | ICD-10-CM | POA: Diagnosis not present

## 2018-07-12 DIAGNOSIS — Z3042 Encounter for surveillance of injectable contraceptive: Secondary | ICD-10-CM | POA: Diagnosis not present

## 2018-08-03 DIAGNOSIS — M791 Myalgia, unspecified site: Secondary | ICD-10-CM | POA: Diagnosis not present

## 2018-09-17 DIAGNOSIS — M545 Low back pain: Secondary | ICD-10-CM | POA: Diagnosis not present

## 2018-09-17 DIAGNOSIS — Z0000 Encounter for general adult medical examination without abnormal findings: Secondary | ICD-10-CM | POA: Diagnosis not present

## 2018-09-17 DIAGNOSIS — J309 Allergic rhinitis, unspecified: Secondary | ICD-10-CM | POA: Diagnosis not present

## 2018-09-17 DIAGNOSIS — G4700 Insomnia, unspecified: Secondary | ICD-10-CM | POA: Diagnosis not present

## 2018-09-17 DIAGNOSIS — R0981 Nasal congestion: Secondary | ICD-10-CM | POA: Diagnosis not present

## 2018-09-28 DIAGNOSIS — J343 Hypertrophy of nasal turbinates: Secondary | ICD-10-CM | POA: Insufficient documentation

## 2018-10-04 DIAGNOSIS — Z0000 Encounter for general adult medical examination without abnormal findings: Secondary | ICD-10-CM | POA: Diagnosis not present

## 2018-10-04 DIAGNOSIS — Z131 Encounter for screening for diabetes mellitus: Secondary | ICD-10-CM | POA: Diagnosis not present

## 2018-10-04 DIAGNOSIS — Z13220 Encounter for screening for lipoid disorders: Secondary | ICD-10-CM | POA: Diagnosis not present

## 2018-10-04 DIAGNOSIS — Z3042 Encounter for surveillance of injectable contraceptive: Secondary | ICD-10-CM | POA: Diagnosis not present

## 2018-10-29 DIAGNOSIS — M79641 Pain in right hand: Secondary | ICD-10-CM | POA: Diagnosis not present

## 2018-10-29 DIAGNOSIS — M25531 Pain in right wrist: Secondary | ICD-10-CM | POA: Diagnosis not present

## 2018-10-29 DIAGNOSIS — M79642 Pain in left hand: Secondary | ICD-10-CM | POA: Diagnosis not present

## 2018-11-25 DIAGNOSIS — R300 Dysuria: Secondary | ICD-10-CM | POA: Diagnosis not present

## 2018-11-25 DIAGNOSIS — R3989 Other symptoms and signs involving the genitourinary system: Secondary | ICD-10-CM | POA: Diagnosis not present

## 2018-11-25 DIAGNOSIS — Z23 Encounter for immunization: Secondary | ICD-10-CM | POA: Diagnosis not present

## 2018-12-27 DIAGNOSIS — Z3042 Encounter for surveillance of injectable contraceptive: Secondary | ICD-10-CM | POA: Diagnosis not present

## 2019-01-04 DIAGNOSIS — M791 Myalgia, unspecified site: Secondary | ICD-10-CM | POA: Diagnosis not present

## 2019-03-21 DIAGNOSIS — Z3042 Encounter for surveillance of injectable contraceptive: Secondary | ICD-10-CM | POA: Diagnosis not present

## 2019-05-23 DIAGNOSIS — G5601 Carpal tunnel syndrome, right upper limb: Secondary | ICD-10-CM | POA: Diagnosis not present

## 2019-05-30 DIAGNOSIS — G5601 Carpal tunnel syndrome, right upper limb: Secondary | ICD-10-CM | POA: Insufficient documentation

## 2019-05-31 DIAGNOSIS — G5601 Carpal tunnel syndrome, right upper limb: Secondary | ICD-10-CM | POA: Diagnosis not present

## 2019-06-07 DIAGNOSIS — S93421A Sprain of deltoid ligament of right ankle, initial encounter: Secondary | ICD-10-CM | POA: Diagnosis not present

## 2019-06-13 DIAGNOSIS — Z3042 Encounter for surveillance of injectable contraceptive: Secondary | ICD-10-CM | POA: Diagnosis not present

## 2019-06-20 DIAGNOSIS — M25571 Pain in right ankle and joints of right foot: Secondary | ICD-10-CM | POA: Diagnosis not present

## 2019-06-20 DIAGNOSIS — M79671 Pain in right foot: Secondary | ICD-10-CM | POA: Diagnosis not present

## 2019-07-11 DIAGNOSIS — M25571 Pain in right ankle and joints of right foot: Secondary | ICD-10-CM | POA: Diagnosis not present

## 2019-07-25 DIAGNOSIS — M722 Plantar fascial fibromatosis: Secondary | ICD-10-CM | POA: Diagnosis not present

## 2019-08-04 DIAGNOSIS — M79671 Pain in right foot: Secondary | ICD-10-CM | POA: Diagnosis not present

## 2019-08-09 DIAGNOSIS — M722 Plantar fascial fibromatosis: Secondary | ICD-10-CM | POA: Diagnosis not present

## 2019-08-11 DIAGNOSIS — M722 Plantar fascial fibromatosis: Secondary | ICD-10-CM | POA: Diagnosis not present

## 2019-08-25 DIAGNOSIS — G5601 Carpal tunnel syndrome, right upper limb: Secondary | ICD-10-CM | POA: Diagnosis not present

## 2019-08-29 DIAGNOSIS — M722 Plantar fascial fibromatosis: Secondary | ICD-10-CM | POA: Diagnosis not present

## 2019-08-29 DIAGNOSIS — M79671 Pain in right foot: Secondary | ICD-10-CM | POA: Diagnosis not present

## 2019-09-05 DIAGNOSIS — Z3042 Encounter for surveillance of injectable contraceptive: Secondary | ICD-10-CM | POA: Diagnosis not present

## 2019-09-12 DIAGNOSIS — M722 Plantar fascial fibromatosis: Secondary | ICD-10-CM | POA: Diagnosis not present

## 2019-09-12 DIAGNOSIS — M25571 Pain in right ankle and joints of right foot: Secondary | ICD-10-CM | POA: Diagnosis not present

## 2019-09-22 ENCOUNTER — Other Ambulatory Visit: Payer: Self-pay | Admitting: Physician Assistant

## 2019-09-22 ENCOUNTER — Other Ambulatory Visit (HOSPITAL_COMMUNITY)
Admission: RE | Admit: 2019-09-22 | Discharge: 2019-09-22 | Disposition: A | Discharge status: Home / Self Care | Payer: BC Managed Care – PPO | Source: Ambulatory Visit | Attending: Physician Assistant | Admitting: Physician Assistant

## 2019-09-22 DIAGNOSIS — Z0000 Encounter for general adult medical examination without abnormal findings: Secondary | ICD-10-CM | POA: Diagnosis not present

## 2019-09-22 DIAGNOSIS — Z124 Encounter for screening for malignant neoplasm of cervix: Secondary | ICD-10-CM | POA: Insufficient documentation

## 2019-09-22 DIAGNOSIS — Z13220 Encounter for screening for lipoid disorders: Secondary | ICD-10-CM | POA: Diagnosis not present

## 2019-09-22 DIAGNOSIS — Z131 Encounter for screening for diabetes mellitus: Secondary | ICD-10-CM | POA: Diagnosis not present

## 2019-09-27 DIAGNOSIS — M79671 Pain in right foot: Secondary | ICD-10-CM | POA: Diagnosis not present

## 2019-09-27 DIAGNOSIS — M722 Plantar fascial fibromatosis: Secondary | ICD-10-CM | POA: Diagnosis not present

## 2019-09-27 LAB — CYTOLOGY - PAP: Diagnosis: NEGATIVE

## 2019-10-05 IMAGING — CT CT ABD-PELV W/ CM
2 of 5 series · 13 of 46 positions shown, 15 images · IV contrast (iopamidol)
Comparison: Lumbar MRI December 21, 2017

CLINICAL DATA: Lower abdominal pain

EXAM:
CT ABDOMEN AND PELVIS WITH CONTRAST
TECHNIQUE: Multidetector CT imaging of the abdomen and pelvis was performed
using the standard protocol following bolus administration of
intravenous contrast. Oral contrast was also administered.
CONTRAST:  100mL XPJZCN-HFF IOPAMIDOL (XPJZCN-HFF) INJECTION 61%

[Series 2: abd pelvis 5.00 br40 s3 ax · axial · 0.56mm/px · z∈[+1247,+1587]mm · 10 of 82 slices shown, 12 images]
[im 7/82  soft-tissue]
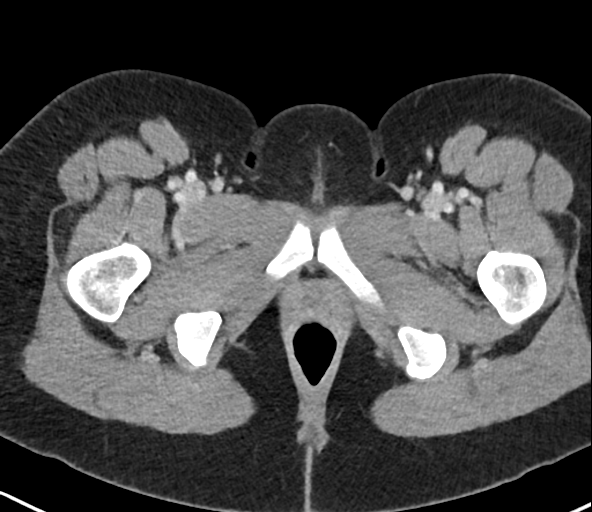
[im 7/82  bone]
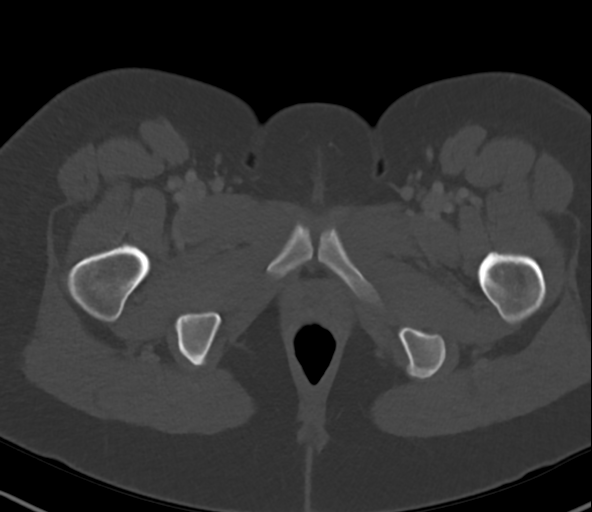
[im 13/82  soft-tissue]
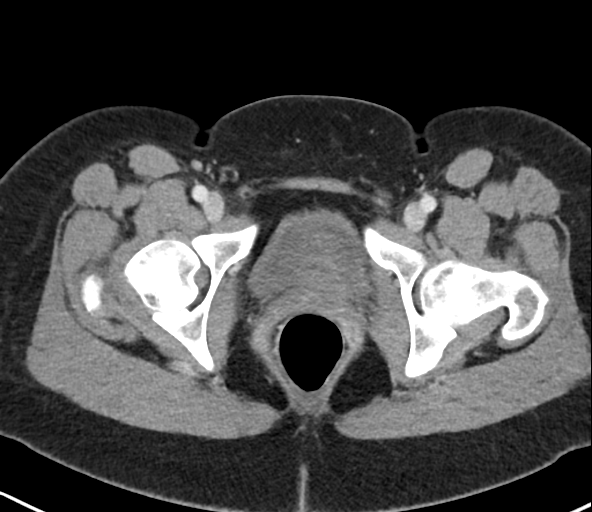
[im 25/82  soft-tissue]
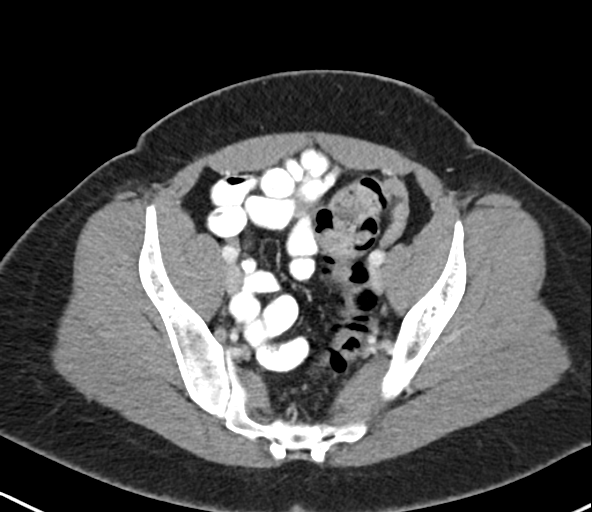
[im 32/82  soft-tissue]
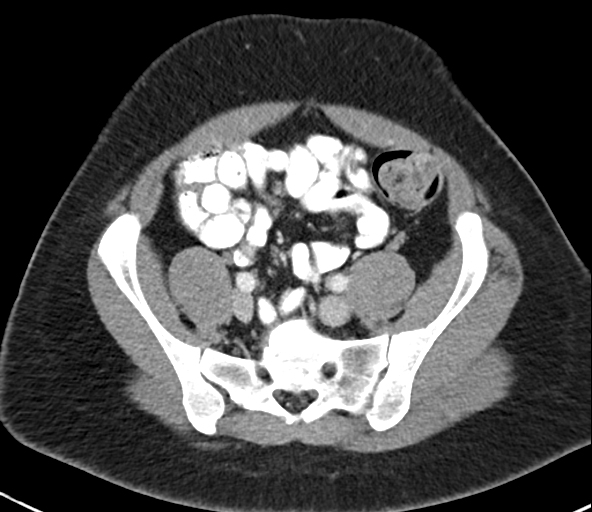
[im 38/82  soft-tissue]
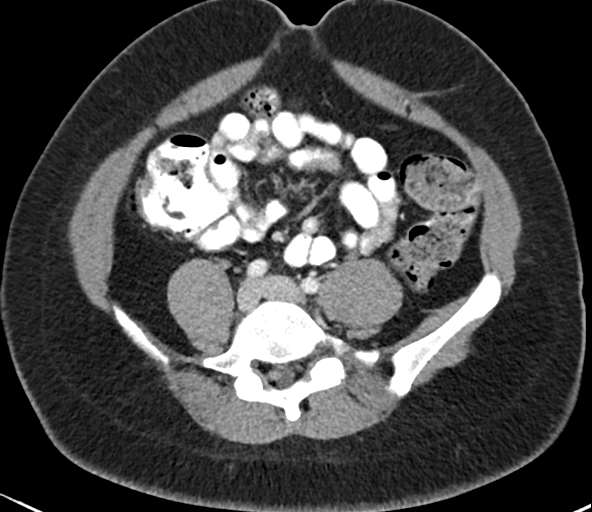
[im 44/82  soft-tissue]
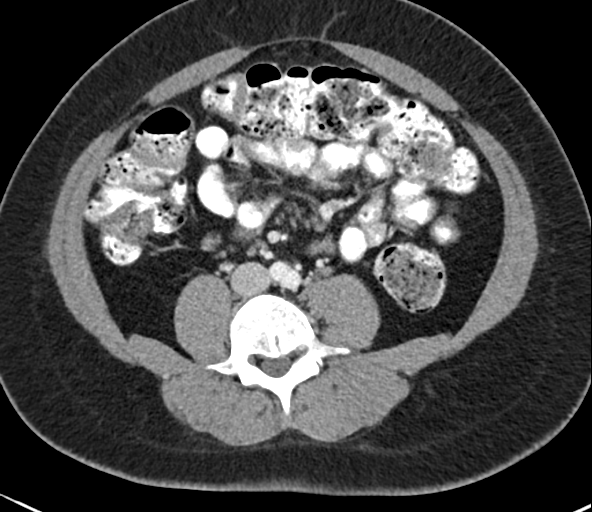
[im 50/82  soft-tissue]
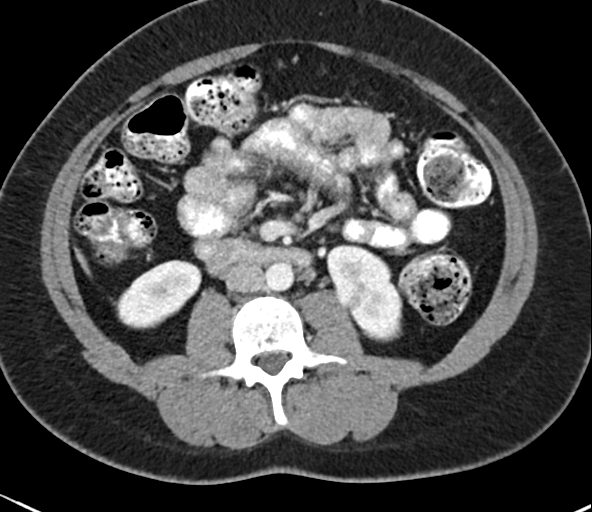
[im 63/82  soft-tissue]
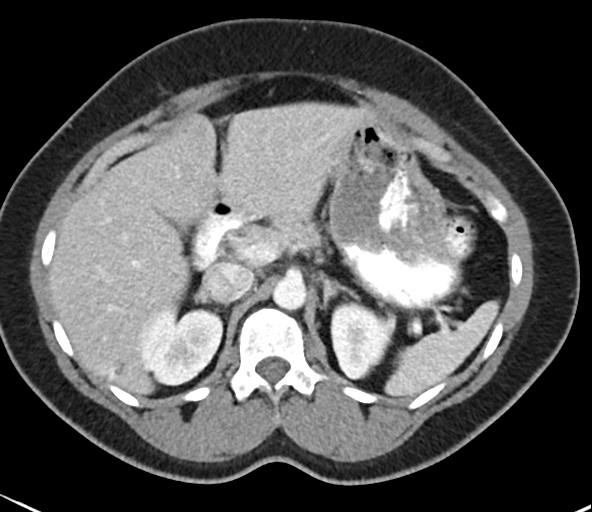
[im 69/82  soft-tissue]
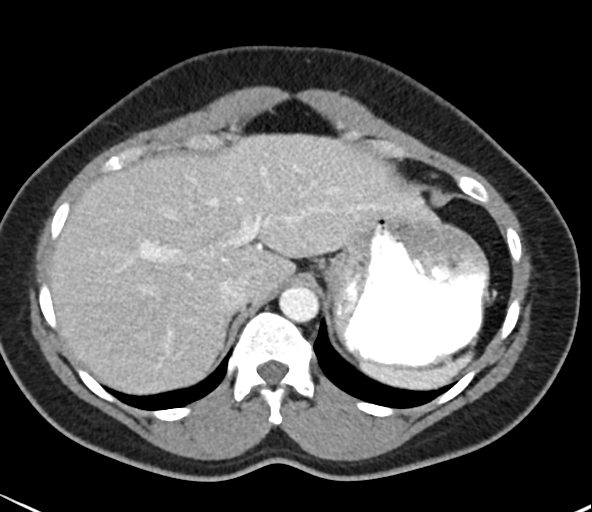
[im 69/82  bone]
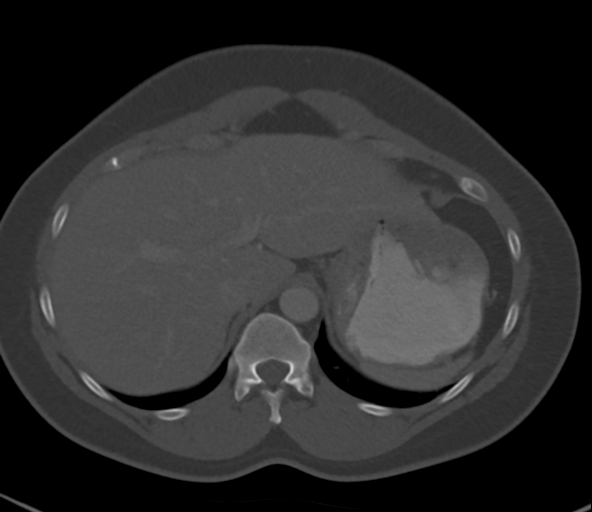
[im 75/82  soft-tissue]
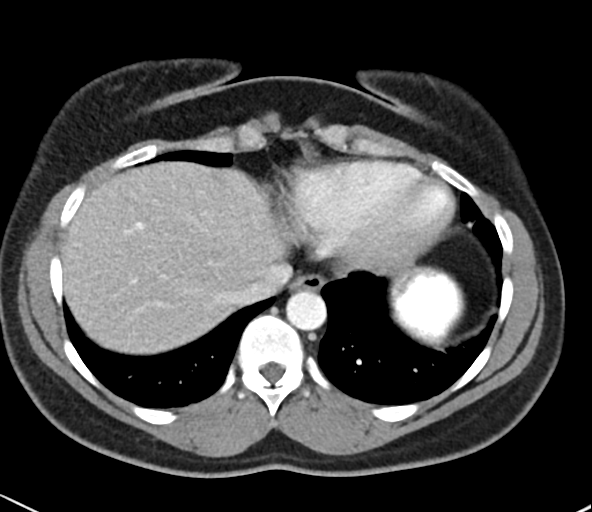

[Series 7: abd pelvis 2.00 br40 s3 cor · coronal · 0.67mm/px · 3 of 150 slices shown]
[im 50/150  soft-tissue]
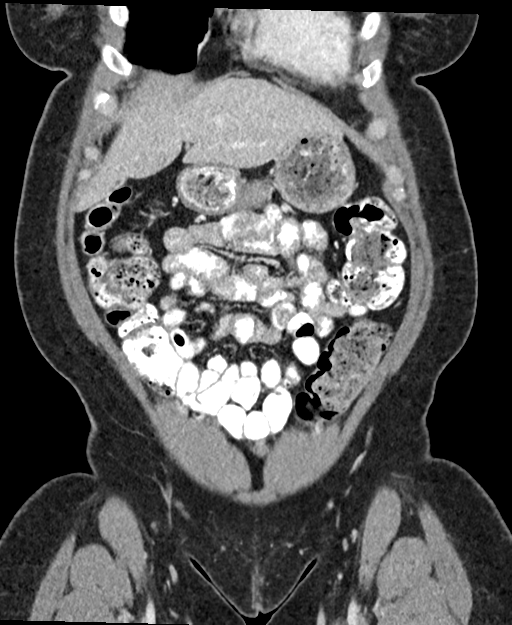
[im 67/150  soft-tissue]
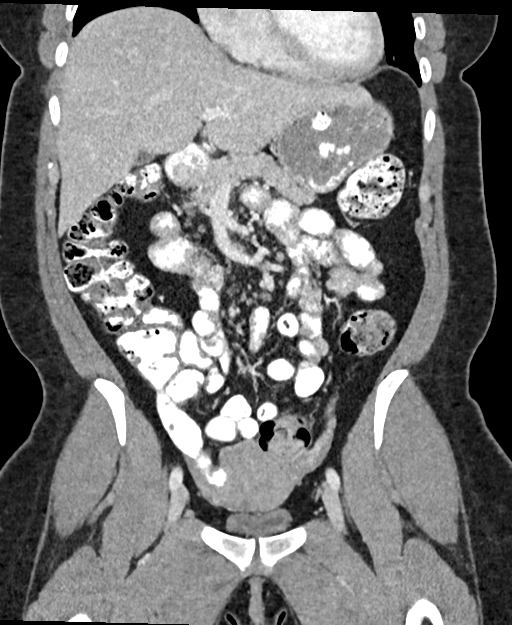
[im 83/150  soft-tissue]
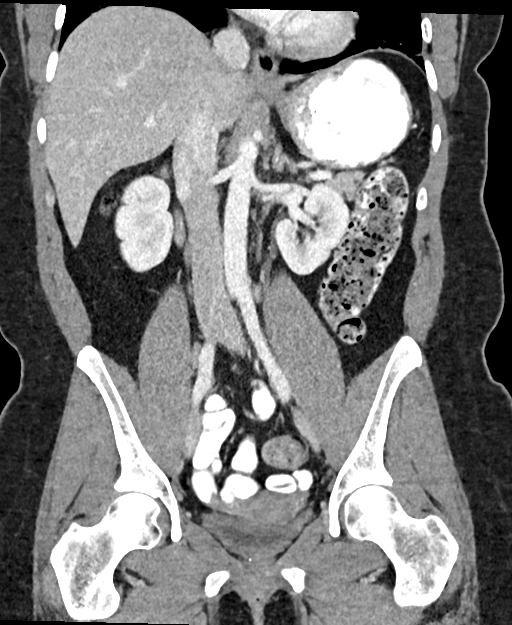

[13 of 46 positions shown; findings below may reference images not displayed]

FINDINGS: Lower chest: There is mild bibasilar atelectasis. There is no lung
base edema or consolidation.

Hepatobiliary: No focal liver lesions are appreciable. Gallbladder
is mildly contracted. No biliary duct dilatation is evident.

Pancreas: There is no appreciable pancreatic mass or inflammatory
focus.

Spleen: No splenic lesions are evident.

Adrenals/Urinary Tract: Adrenals bilaterally appear unremarkable.
There is a cyst in the anterior mid left kidney measuring 1.5 x
cm. There is a 1.1 x 1.1 cm cyst in the lower pole left kidney
medially. There are probable subcentimeter cysts in each kidney, too
small to characterize confidently on this study. There is no
hydronephrosis on either side. There is no appreciable renal or
ureteral calculus on either side. Urinary bladder is midline.
Urinary bladder wall appears slightly thickened overall.

Stomach/Bowel: There is moderate stool throughout the colon. There
is no appreciable bowel wall or mesenteric thickening. There is no
evident bowel obstruction. There is no free air or portal venous
air.

Vascular/Lymphatic: There is no abdominal aortic aneurysm. No
vascular lesions are appreciable. There is no adenopathy in the
abdomen or pelvis by size criteria. There are scattered
subcentimeter mesenteric lymph nodes.

Reproductive: The uterus is anteverted. No pelvic mass is
demonstrable by CT.

Other: Appendix appears unremarkable. There is no evident abscess or
ascites in the abdomen or pelvis. There is midline rectus muscle
thinning. Only fat is seen in this area of rectus muscle thinning
anteriorly.

Musculoskeletal: Segmental anomaly involving the L4 vertebral body,
stable from prior MR. No blastic or lytic bone lesions are evident.
No intramuscular lesion or abdominal wall lesion evident.
IMPRESSION: 1. There is felt to be a degree of urinary bladder wall thickening,
concerning for cystitis. No hydronephrosis on either side. No
evident renal or ureteral calculus on either side.

2. No demonstrable bowel obstruction. No evident diverticulitis.
Appendix appears normal. No evident abscess in the abdomen or
pelvis.

3. Congenital segmental anomaly involving the L4 vertebral body,
stable.

4. No adenopathy by size criteria. Subcentimeter mesenteric lymph
nodes noted. These lymph nodes are considered nonspecific based on
CT appearance. In the appropriate clinical setting, a degree of
mesenteric adenitis could present in this manner.

## 2019-11-29 DIAGNOSIS — Z3042 Encounter for surveillance of injectable contraceptive: Secondary | ICD-10-CM | POA: Diagnosis not present

## 2019-12-06 DIAGNOSIS — G5601 Carpal tunnel syndrome, right upper limb: Secondary | ICD-10-CM | POA: Diagnosis not present

## 2019-12-27 DIAGNOSIS — B349 Viral infection, unspecified: Secondary | ICD-10-CM | POA: Diagnosis not present

## 2019-12-27 DIAGNOSIS — J069 Acute upper respiratory infection, unspecified: Secondary | ICD-10-CM | POA: Diagnosis not present

## 2019-12-27 DIAGNOSIS — Z20828 Contact with and (suspected) exposure to other viral communicable diseases: Secondary | ICD-10-CM | POA: Diagnosis not present

## 2020-02-20 DIAGNOSIS — Z23 Encounter for immunization: Secondary | ICD-10-CM | POA: Diagnosis not present

## 2020-02-20 DIAGNOSIS — Z3042 Encounter for surveillance of injectable contraceptive: Secondary | ICD-10-CM | POA: Diagnosis not present

## 2020-05-14 DIAGNOSIS — Z3042 Encounter for surveillance of injectable contraceptive: Secondary | ICD-10-CM | POA: Diagnosis not present

## 2020-06-06 DIAGNOSIS — S1081XA Abrasion of other specified part of neck, initial encounter: Secondary | ICD-10-CM | POA: Diagnosis not present

## 2020-06-06 DIAGNOSIS — L089 Local infection of the skin and subcutaneous tissue, unspecified: Secondary | ICD-10-CM | POA: Diagnosis not present

## 2020-07-02 DIAGNOSIS — L218 Other seborrheic dermatitis: Secondary | ICD-10-CM | POA: Diagnosis not present

## 2020-07-02 DIAGNOSIS — L658 Other specified nonscarring hair loss: Secondary | ICD-10-CM | POA: Diagnosis not present

## 2020-08-06 DIAGNOSIS — Z3042 Encounter for surveillance of injectable contraceptive: Secondary | ICD-10-CM | POA: Diagnosis not present

## 2020-08-27 DIAGNOSIS — L218 Other seborrheic dermatitis: Secondary | ICD-10-CM | POA: Diagnosis not present

## 2020-08-27 DIAGNOSIS — L658 Other specified nonscarring hair loss: Secondary | ICD-10-CM | POA: Diagnosis not present

## 2020-10-29 DIAGNOSIS — Z13220 Encounter for screening for lipoid disorders: Secondary | ICD-10-CM | POA: Diagnosis not present

## 2020-10-29 DIAGNOSIS — Z0000 Encounter for general adult medical examination without abnormal findings: Secondary | ICD-10-CM | POA: Diagnosis not present

## 2020-10-29 DIAGNOSIS — L659 Nonscarring hair loss, unspecified: Secondary | ICD-10-CM | POA: Diagnosis not present

## 2020-10-29 DIAGNOSIS — Z3042 Encounter for surveillance of injectable contraceptive: Secondary | ICD-10-CM | POA: Diagnosis not present

## 2021-01-18 DIAGNOSIS — G5602 Carpal tunnel syndrome, left upper limb: Secondary | ICD-10-CM | POA: Diagnosis not present

## 2021-01-21 DIAGNOSIS — Z3042 Encounter for surveillance of injectable contraceptive: Secondary | ICD-10-CM | POA: Diagnosis not present

## 2021-01-21 DIAGNOSIS — Z23 Encounter for immunization: Secondary | ICD-10-CM | POA: Diagnosis not present

## 2021-04-02 DIAGNOSIS — Z20822 Contact with and (suspected) exposure to covid-19: Secondary | ICD-10-CM | POA: Diagnosis not present

## 2021-04-02 DIAGNOSIS — U071 COVID-19: Secondary | ICD-10-CM | POA: Diagnosis not present

## 2021-04-03 DIAGNOSIS — U071 COVID-19: Secondary | ICD-10-CM | POA: Diagnosis not present

## 2021-04-16 DIAGNOSIS — Z3042 Encounter for surveillance of injectable contraceptive: Secondary | ICD-10-CM | POA: Diagnosis not present

## 2021-05-28 DIAGNOSIS — G5601 Carpal tunnel syndrome, right upper limb: Secondary | ICD-10-CM | POA: Diagnosis not present

## 2021-06-05 DIAGNOSIS — L218 Other seborrheic dermatitis: Secondary | ICD-10-CM | POA: Diagnosis not present

## 2021-06-05 DIAGNOSIS — L650 Telogen effluvium: Secondary | ICD-10-CM | POA: Diagnosis not present

## 2021-06-10 DIAGNOSIS — R400 Somnolence: Secondary | ICD-10-CM | POA: Diagnosis not present

## 2021-06-10 DIAGNOSIS — G478 Other sleep disorders: Secondary | ICD-10-CM | POA: Diagnosis not present

## 2021-06-12 DIAGNOSIS — M7021 Olecranon bursitis, right elbow: Secondary | ICD-10-CM | POA: Diagnosis not present

## 2021-06-18 DIAGNOSIS — M7711 Lateral epicondylitis, right elbow: Secondary | ICD-10-CM | POA: Diagnosis not present

## 2021-06-18 DIAGNOSIS — M25521 Pain in right elbow: Secondary | ICD-10-CM | POA: Diagnosis not present

## 2021-06-26 DIAGNOSIS — M7711 Lateral epicondylitis, right elbow: Secondary | ICD-10-CM | POA: Insufficient documentation

## 2021-06-26 DIAGNOSIS — M25521 Pain in right elbow: Secondary | ICD-10-CM | POA: Diagnosis not present

## 2021-07-01 DIAGNOSIS — M25521 Pain in right elbow: Secondary | ICD-10-CM | POA: Diagnosis not present

## 2021-07-04 DIAGNOSIS — M25521 Pain in right elbow: Secondary | ICD-10-CM | POA: Diagnosis not present

## 2021-07-04 DIAGNOSIS — J029 Acute pharyngitis, unspecified: Secondary | ICD-10-CM | POA: Diagnosis not present

## 2021-07-08 DIAGNOSIS — Z3042 Encounter for surveillance of injectable contraceptive: Secondary | ICD-10-CM | POA: Diagnosis not present

## 2021-07-08 DIAGNOSIS — M25521 Pain in right elbow: Secondary | ICD-10-CM | POA: Diagnosis not present

## 2021-07-08 DIAGNOSIS — L678 Other hair color and hair shaft abnormalities: Secondary | ICD-10-CM | POA: Diagnosis not present

## 2021-07-08 DIAGNOSIS — L650 Telogen effluvium: Secondary | ICD-10-CM | POA: Diagnosis not present

## 2021-07-10 DIAGNOSIS — M7711 Lateral epicondylitis, right elbow: Secondary | ICD-10-CM | POA: Diagnosis not present

## 2021-07-14 DIAGNOSIS — J020 Streptococcal pharyngitis: Secondary | ICD-10-CM | POA: Diagnosis not present

## 2021-07-15 DIAGNOSIS — M25521 Pain in right elbow: Secondary | ICD-10-CM | POA: Diagnosis not present

## 2021-07-17 DIAGNOSIS — M25521 Pain in right elbow: Secondary | ICD-10-CM | POA: Diagnosis not present

## 2021-07-23 DIAGNOSIS — M25521 Pain in right elbow: Secondary | ICD-10-CM | POA: Diagnosis not present

## 2021-07-24 DIAGNOSIS — M7711 Lateral epicondylitis, right elbow: Secondary | ICD-10-CM | POA: Diagnosis not present

## 2021-07-29 DIAGNOSIS — M25521 Pain in right elbow: Secondary | ICD-10-CM | POA: Diagnosis not present

## 2021-08-01 DIAGNOSIS — M25521 Pain in right elbow: Secondary | ICD-10-CM | POA: Diagnosis not present

## 2021-08-05 DIAGNOSIS — R000 Tachycardia, unspecified: Secondary | ICD-10-CM | POA: Diagnosis not present

## 2021-08-07 DIAGNOSIS — J029 Acute pharyngitis, unspecified: Secondary | ICD-10-CM | POA: Diagnosis not present

## 2021-08-07 DIAGNOSIS — R682 Dry mouth, unspecified: Secondary | ICD-10-CM | POA: Diagnosis not present

## 2021-08-07 DIAGNOSIS — J309 Allergic rhinitis, unspecified: Secondary | ICD-10-CM | POA: Diagnosis not present

## 2021-08-09 DIAGNOSIS — M25521 Pain in right elbow: Secondary | ICD-10-CM | POA: Diagnosis not present

## 2021-08-13 DIAGNOSIS — M25521 Pain in right elbow: Secondary | ICD-10-CM | POA: Diagnosis not present

## 2021-08-15 DIAGNOSIS — R000 Tachycardia, unspecified: Secondary | ICD-10-CM | POA: Diagnosis not present

## 2021-08-16 DIAGNOSIS — M25521 Pain in right elbow: Secondary | ICD-10-CM | POA: Diagnosis not present

## 2021-08-20 DIAGNOSIS — R000 Tachycardia, unspecified: Secondary | ICD-10-CM | POA: Diagnosis not present

## 2021-08-20 DIAGNOSIS — M25521 Pain in right elbow: Secondary | ICD-10-CM | POA: Diagnosis not present

## 2021-08-21 DIAGNOSIS — R000 Tachycardia, unspecified: Secondary | ICD-10-CM | POA: Diagnosis not present

## 2021-08-23 DIAGNOSIS — R000 Tachycardia, unspecified: Secondary | ICD-10-CM | POA: Diagnosis not present

## 2021-08-26 DIAGNOSIS — M7711 Lateral epicondylitis, right elbow: Secondary | ICD-10-CM | POA: Diagnosis not present

## 2021-09-18 DIAGNOSIS — L678 Other hair color and hair shaft abnormalities: Secondary | ICD-10-CM | POA: Diagnosis not present

## 2021-09-18 DIAGNOSIS — L648 Other androgenic alopecia: Secondary | ICD-10-CM | POA: Diagnosis not present

## 2021-09-18 DIAGNOSIS — L218 Other seborrheic dermatitis: Secondary | ICD-10-CM | POA: Diagnosis not present

## 2021-09-20 DIAGNOSIS — R109 Unspecified abdominal pain: Secondary | ICD-10-CM | POA: Diagnosis not present

## 2021-09-20 DIAGNOSIS — K5900 Constipation, unspecified: Secondary | ICD-10-CM | POA: Diagnosis not present

## 2021-09-20 DIAGNOSIS — R194 Change in bowel habit: Secondary | ICD-10-CM | POA: Diagnosis not present

## 2021-09-23 ENCOUNTER — Ambulatory Visit
Admission: RE | Admit: 2021-09-23 | Discharge: 2021-09-23 | Disposition: A | Discharge status: Home / Self Care | Payer: BC Managed Care – PPO | Source: Ambulatory Visit | Attending: Physician Assistant | Admitting: Physician Assistant

## 2021-09-23 ENCOUNTER — Other Ambulatory Visit: Payer: Self-pay | Admitting: Physician Assistant

## 2021-09-23 DIAGNOSIS — R109 Unspecified abdominal pain: Secondary | ICD-10-CM

## 2021-09-23 DIAGNOSIS — K5900 Constipation, unspecified: Secondary | ICD-10-CM | POA: Diagnosis not present

## 2021-09-26 DIAGNOSIS — M25521 Pain in right elbow: Secondary | ICD-10-CM | POA: Diagnosis not present

## 2021-09-30 DIAGNOSIS — Z3042 Encounter for surveillance of injectable contraceptive: Secondary | ICD-10-CM | POA: Diagnosis not present

## 2021-10-05 DIAGNOSIS — K120 Recurrent oral aphthae: Secondary | ICD-10-CM | POA: Diagnosis not present

## 2021-10-05 DIAGNOSIS — Z681 Body mass index (BMI) 19 or less, adult: Secondary | ICD-10-CM | POA: Diagnosis not present

## 2021-10-05 DIAGNOSIS — R1013 Epigastric pain: Secondary | ICD-10-CM | POA: Diagnosis not present

## 2021-10-05 DIAGNOSIS — Z6829 Body mass index (BMI) 29.0-29.9, adult: Secondary | ICD-10-CM | POA: Diagnosis not present

## 2021-10-06 DIAGNOSIS — R0981 Nasal congestion: Secondary | ICD-10-CM | POA: Diagnosis not present

## 2021-10-06 DIAGNOSIS — J392 Other diseases of pharynx: Secondary | ICD-10-CM | POA: Diagnosis not present

## 2021-10-06 DIAGNOSIS — H6993 Unspecified Eustachian tube disorder, bilateral: Secondary | ICD-10-CM | POA: Diagnosis not present

## 2021-10-28 DIAGNOSIS — M25521 Pain in right elbow: Secondary | ICD-10-CM | POA: Diagnosis not present

## 2021-10-30 DIAGNOSIS — Z0000 Encounter for general adult medical examination without abnormal findings: Secondary | ICD-10-CM | POA: Diagnosis not present

## 2021-10-30 DIAGNOSIS — Z13220 Encounter for screening for lipoid disorders: Secondary | ICD-10-CM | POA: Diagnosis not present

## 2021-11-26 DIAGNOSIS — M25521 Pain in right elbow: Secondary | ICD-10-CM | POA: Diagnosis not present

## 2021-12-23 DIAGNOSIS — Z23 Encounter for immunization: Secondary | ICD-10-CM | POA: Diagnosis not present

## 2021-12-23 DIAGNOSIS — Z3042 Encounter for surveillance of injectable contraceptive: Secondary | ICD-10-CM | POA: Diagnosis not present

## 2022-01-07 DIAGNOSIS — R109 Unspecified abdominal pain: Secondary | ICD-10-CM | POA: Diagnosis not present

## 2022-01-07 DIAGNOSIS — Z6830 Body mass index (BMI) 30.0-30.9, adult: Secondary | ICD-10-CM | POA: Diagnosis not present

## 2022-03-15 DIAGNOSIS — S76311A Strain of muscle, fascia and tendon of the posterior muscle group at thigh level, right thigh, initial encounter: Secondary | ICD-10-CM | POA: Diagnosis not present

## 2022-03-18 DIAGNOSIS — Z3042 Encounter for surveillance of injectable contraceptive: Secondary | ICD-10-CM | POA: Diagnosis not present

## 2022-04-17 DIAGNOSIS — K5909 Other constipation: Secondary | ICD-10-CM | POA: Diagnosis not present

## 2022-04-23 DIAGNOSIS — L648 Other androgenic alopecia: Secondary | ICD-10-CM | POA: Diagnosis not present

## 2022-04-23 DIAGNOSIS — L218 Other seborrheic dermatitis: Secondary | ICD-10-CM | POA: Diagnosis not present

## 2022-04-23 DIAGNOSIS — L678 Other hair color and hair shaft abnormalities: Secondary | ICD-10-CM | POA: Diagnosis not present

## 2022-05-28 DIAGNOSIS — R030 Elevated blood-pressure reading, without diagnosis of hypertension: Secondary | ICD-10-CM | POA: Diagnosis not present

## 2022-05-28 DIAGNOSIS — Z789 Other specified health status: Secondary | ICD-10-CM | POA: Diagnosis not present

## 2022-05-28 DIAGNOSIS — Z6829 Body mass index (BMI) 29.0-29.9, adult: Secondary | ICD-10-CM | POA: Diagnosis not present

## 2022-05-28 DIAGNOSIS — R682 Dry mouth, unspecified: Secondary | ICD-10-CM | POA: Diagnosis not present

## 2022-05-28 DIAGNOSIS — M3500 Sicca syndrome, unspecified: Secondary | ICD-10-CM | POA: Diagnosis not present

## 2022-06-03 DIAGNOSIS — N6001 Solitary cyst of right breast: Secondary | ICD-10-CM | POA: Diagnosis not present

## 2022-06-03 DIAGNOSIS — N6012 Diffuse cystic mastopathy of left breast: Secondary | ICD-10-CM | POA: Diagnosis not present

## 2022-06-03 DIAGNOSIS — R922 Inconclusive mammogram: Secondary | ICD-10-CM | POA: Diagnosis not present

## 2022-06-05 ENCOUNTER — Other Ambulatory Visit: Payer: Self-pay | Admitting: Radiology

## 2022-06-05 DIAGNOSIS — D241 Benign neoplasm of right breast: Secondary | ICD-10-CM | POA: Diagnosis not present

## 2022-06-05 DIAGNOSIS — N6315 Unspecified lump in the right breast, overlapping quadrants: Secondary | ICD-10-CM | POA: Diagnosis not present

## 2022-06-10 DIAGNOSIS — Z3042 Encounter for surveillance of injectable contraceptive: Secondary | ICD-10-CM | POA: Diagnosis not present

## 2022-06-30 DIAGNOSIS — K5904 Chronic idiopathic constipation: Secondary | ICD-10-CM | POA: Diagnosis not present

## 2022-07-19 DIAGNOSIS — G5603 Carpal tunnel syndrome, bilateral upper limbs: Secondary | ICD-10-CM | POA: Diagnosis not present

## 2022-07-19 DIAGNOSIS — Z6829 Body mass index (BMI) 29.0-29.9, adult: Secondary | ICD-10-CM | POA: Diagnosis not present

## 2022-08-19 DIAGNOSIS — K5904 Chronic idiopathic constipation: Secondary | ICD-10-CM | POA: Diagnosis not present

## 2022-09-01 DIAGNOSIS — Z3042 Encounter for surveillance of injectable contraceptive: Secondary | ICD-10-CM | POA: Diagnosis not present

## 2022-10-07 DIAGNOSIS — J069 Acute upper respiratory infection, unspecified: Secondary | ICD-10-CM | POA: Diagnosis not present

## 2022-10-07 DIAGNOSIS — R059 Cough, unspecified: Secondary | ICD-10-CM | POA: Diagnosis not present

## 2022-10-27 DIAGNOSIS — L218 Other seborrheic dermatitis: Secondary | ICD-10-CM | POA: Diagnosis not present

## 2022-10-27 DIAGNOSIS — L678 Other hair color and hair shaft abnormalities: Secondary | ICD-10-CM | POA: Diagnosis not present

## 2022-11-03 DIAGNOSIS — R000 Tachycardia, unspecified: Secondary | ICD-10-CM | POA: Diagnosis not present

## 2022-11-03 DIAGNOSIS — Z131 Encounter for screening for diabetes mellitus: Secondary | ICD-10-CM | POA: Diagnosis not present

## 2022-11-03 DIAGNOSIS — Z124 Encounter for screening for malignant neoplasm of cervix: Secondary | ICD-10-CM | POA: Diagnosis not present

## 2022-11-03 DIAGNOSIS — Z0000 Encounter for general adult medical examination without abnormal findings: Secondary | ICD-10-CM | POA: Diagnosis not present

## 2022-11-03 DIAGNOSIS — G4700 Insomnia, unspecified: Secondary | ICD-10-CM | POA: Diagnosis not present

## 2022-11-03 DIAGNOSIS — L918 Other hypertrophic disorders of the skin: Secondary | ICD-10-CM | POA: Diagnosis not present

## 2022-11-25 DIAGNOSIS — Z3042 Encounter for surveillance of injectable contraceptive: Secondary | ICD-10-CM | POA: Diagnosis not present

## 2022-11-28 DIAGNOSIS — L918 Other hypertrophic disorders of the skin: Secondary | ICD-10-CM | POA: Diagnosis not present

## 2023-02-11 DIAGNOSIS — G5603 Carpal tunnel syndrome, bilateral upper limbs: Secondary | ICD-10-CM | POA: Diagnosis not present

## 2023-02-17 DIAGNOSIS — Z23 Encounter for immunization: Secondary | ICD-10-CM | POA: Diagnosis not present

## 2023-02-17 DIAGNOSIS — Z3042 Encounter for surveillance of injectable contraceptive: Secondary | ICD-10-CM | POA: Diagnosis not present

## 2023-04-08 DIAGNOSIS — G5601 Carpal tunnel syndrome, right upper limb: Secondary | ICD-10-CM | POA: Diagnosis not present

## 2023-05-12 DIAGNOSIS — Z3042 Encounter for surveillance of injectable contraceptive: Secondary | ICD-10-CM | POA: Diagnosis not present

## 2023-05-19 DIAGNOSIS — G5603 Carpal tunnel syndrome, bilateral upper limbs: Secondary | ICD-10-CM | POA: Diagnosis not present

## 2023-06-05 DIAGNOSIS — Z1231 Encounter for screening mammogram for malignant neoplasm of breast: Secondary | ICD-10-CM | POA: Diagnosis not present

## 2023-06-26 NOTE — Progress Notes (Signed)
 Referring Physician:  Hurman Horn, MD [phone] [address] [place],  [place] [address]  Primary Physician:  Milus Height, PA  History of Present Illness: 07/01/2023 Elizabeth [name] is here today with a chief complaint of bilateral hand numbness and tingling.  She states that she has had issues with numbness and tingling in her right hand for approximately 5 years and in her left hand for 1 to 2 years.  Her last EMG was 5 years ago.  She states that she has numbness tingling and pain in her first 4 digits of her right hand and worse in her 2nd and 3rd digits of her left hand.  This is accompanied with sharp pain with numbness and tingling.  She has been wearing braces at night and recently underwent injections in bilateral wrist which have helped her pain tremendously.  She comes today to establish care and discuss potential surgery for carpal tunnel symptoms.  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  She notices her symptoms are worse at night as well as when using a box cutter repeatedly for her job.  She denies any neck pain or neck pain that radiates down her arm.  Conservative measures:  Physical therapy:  has not participated in PT Multimodal medical therapy including regular antiinflammatories:  Prednisone, Ibuprofen Injections: no epidural steroid injections  Past Surgery: none  Elizabeth [name] has no symptoms of cervical myelopathy.  The symptoms are causing a significant impact on the patient's life.   Review of Systems:  A 10 point review of systems is negative, except for the pertinent positives and negatives detailed in the HPI.  Past Medical History: Past Medical History:  Diagnosis Date   History of IBS    Trichomonal infection     Past Surgical History: Past Surgical History:  Procedure Laterality Date   CESAREAN SECTION      Allergies: Allergies as of 07/01/2023   (No Known Allergies)     Medications: Outpatient Encounter Medications as of 07/01/2023  Medication Sig   lubiprostone (AMITIZA) 24 MCG capsule Take 24 mcg by mouth 2 (two) times daily with a meal.   medroxyPROGESTERone (DEPO-PROVERA) 150 MG/ML injection Inject 150 mg into the muscle every 3 (three) months.   propranolol ER (INDERAL LA) 60 MG 24 hr capsule TAKE 1 CAPSULE BY MOUTH ONCE DAILY FOR ELEVATED HEART RATE   traZODone (DESYREL) 50 MG tablet    [DISCONTINUED] acetaminophen (TYLENOL) 500 MG tablet Take 1 tablet (500 mg total) by mouth every 6 (six) hours as needed. (Patient not taking: Reported on 07/26/2015)   [DISCONTINUED] cyclobenzaprine (FLEXERIL) 10 MG tablet Take 1 tablet (10 mg total) by mouth 3 (three) times daily as needed for muscle spasms. (Patient not taking: Reported on 07/26/2015)   [DISCONTINUED] diclofenac sodium (VOLTAREN) 1 % GEL Apply 1 application topically 4 (four) times daily. (Patient not taking: Reported on 07/26/2015)   [DISCONTINUED] guaiFENesin (ROBITUSSIN) 100 MG/5ML liquid Take 5-10 mLs (100-200 mg total) by mouth every 4 (four) hours as needed for cough. (Patient not taking: Reported on 07/26/2015)   [DISCONTINUED] ibuprofen (ADVIL,MOTRIN) 800 MG tablet Take 1 tablet (800 mg total) by mouth every 6 (six) hours as needed for moderate pain. (Patient not taking: Reported on 07/26/2015)   No facility-administered encounter medications on file as of 07/01/2023.    Social History: Social History   Tobacco Use   Smoking status: Never   Smokeless tobacco: Never  Substance Use Topics  Alcohol use: No   Drug use: No    Family Medical History: History reviewed. No pertinent family history.  Physical Examination: @VITALWITHPAIN @  General: Patient is well developed, well nourished, calm, collected, and in no apparent distress. Attention to examination is appropriate.  Psychiatric: Patient is non-anxious.  Head:  Pupils equal, round, and reactive to light.  ENT:  Oral mucosa appears  well hydrated.  Neck:   Supple.  Full range of motion.  Respiratory: Patient is breathing without any difficulty.  Extremities: No edema.  Vascular: Palpable dorsal pedal pulses.  Skin:   On exposed skin, there are no abnormal skin lesions.  NEUROLOGICAL:     Awake, alert, oriented to person, place, and time.  Speech is clear and fluent. Fund of knowledge is appropriate.   Cranial Nerves: Pupils equal round and reactive to light.  Facial tone is symmetric.     Positive Tinel of bilateral median nerves at the wrist, positive carpal compression test bilaterally. No significant thenar or hypothenar wasting seen in her hands.   Strength: Side Biceps Triceps Deltoid Interossei Grip Wrist Ext. Wrist Flex.  R 5 5 5  4+ 5 5 5   L 5 5 5  4+ 5 5 5     Reflexes are 2+ and symmetric at the biceps, triceps, brachioradialis.    Bilateral upper and lower extremity sensation is intact to light touch.    Gait is normal.   No difficulty with tandem gait.   No evidence of dysmetria noted.  Medical Decision Making  Imaging: No recent imaging able to be reviewed.  Assessment and Plan: Elizabeth [name] is a pleasant 43 y.o. female is here today with a chief complaint of bilateral hand numbness and tingling.  She states that she has had issues with numbness and tingling in her right hand for approximately 5 years and in her left hand for 1 to 2 years.  Her last EMG was 5 years ago.  She states that she has numbness tingling and pain in her first 4 digits of her right hand and worse in her 2nd and 3rd digits of her left hand.  This is accompanied with sharp pain with numbness and tingling.  She has been wearing braces at night and recently underwent injections in bilateral wrist which have helped her pain tremendously.  She comes today to establish care and discuss potential surgery for carpal tunnel symptoms.  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  She notices her symptoms  are worse at night as well as when using a box cutter repeatedly for her job.  She denies any neck pain or neck pain that radiates down her arm.  On examination, Positive Tinel of bilateral median nerves at the wrist, positive carpal compression test bilaterally. No significant thenar or hypothenar wasting seen in her hands.  Mild weakness.  Pleasure to see patient in clinic today.  She likely has bilateral carpal tunnel syndrome, right worse than left.  Her right hand is her dominant hand.  I am pleased that she has responded so well to injections in her carpal tunnel.  Plan for EMG in the future to confirm carpal tunnel syndrome.  We did discuss ultrasound-guided carpal tunnel release in depth.  She was also given additional information via pamphlets regarding the surgery and recovery.  Will call to discuss with the patient more after her EMG is complete.  Encouraged her to reach out to me in the meantime for any questions or concerns  she has.    Thank you for involving me in the care of this patient.   I spent a total of 45 minutes in both face-to-face and non-face-to-face activities for this visit on the date of this encounter including preparing to see the patient, obtaining and reviewing history, performing examination, counseling the patient, ordering additional procedure, documenting information, and care coronation.  Joan Flores, PA-C Dept. of Neurosurgery

## 2023-07-01 ENCOUNTER — Ambulatory Visit: Payer: BC Managed Care – PPO | Admitting: Physician Assistant

## 2023-07-01 ENCOUNTER — Encounter: Payer: Self-pay | Admitting: Physician Assistant

## 2023-07-01 VITALS — BP 118/82 | Ht 64.0 in | Wt 179.0 lb

## 2023-07-01 DIAGNOSIS — G5603 Carpal tunnel syndrome, bilateral upper limbs: Secondary | ICD-10-CM | POA: Diagnosis not present

## 2023-07-03 DIAGNOSIS — R209 Unspecified disturbances of skin sensation: Secondary | ICD-10-CM | POA: Diagnosis not present

## 2023-07-09 ENCOUNTER — Other Ambulatory Visit: Payer: Self-pay

## 2023-07-09 ENCOUNTER — Encounter: Payer: Self-pay | Admitting: Neurology

## 2023-07-09 DIAGNOSIS — R202 Paresthesia of skin: Secondary | ICD-10-CM

## 2023-07-31 DIAGNOSIS — Z3042 Encounter for surveillance of injectable contraceptive: Secondary | ICD-10-CM | POA: Diagnosis not present

## 2023-08-03 ENCOUNTER — Ambulatory Visit: Admitting: Neurology

## 2023-08-03 DIAGNOSIS — G5603 Carpal tunnel syndrome, bilateral upper limbs: Secondary | ICD-10-CM

## 2023-08-03 NOTE — Procedures (Signed)
 [facility]  [address] [place], [address]  [place], [place] [address] Tel: [phone] Fax: [phone] Test Date:  08/03/2023  Patient: Elizabeth [name] DOB: [birth year] Physician: Rommie Coats, MD  Sex: Female Height: 5\' 4"  Ref Phys: Ludwig Safer, PA-C  ID#: [id number]   Technician:    History: This is a 43 year old female with bilateral hand numbness and tingling.  NCV & EMG Findings: Extensive electrodiagnostic evaluation of bilateral upper limbs shows: Left median sensory response shows prolonged distal peak latency (4.1 ms) and reduced amplitude (14 V). Right median sensory response shows prolonged distal peak latency (3.6 ms). Bilateral ulnar and radial sensory responses are within normal limits. Left median (APB) motor response shows prolonged distal onset latency (4.2 ms) and reduced amplitude (3.6 mV). Right median (APB) and bilateral ulnar (ADM) motor responses are within normal limits. Chronic motor axon loss changes without accompanying active denervation changes are seen in the left abductor pollicis brevis muscle.  Neuromuscular Ultrasound Findings: High frequency (4.0-16.0 MHz) B-mode, nonvascular ultrasound of the bilateral upper limbs shows: Cross sectional areas (CSA) of bilateral median (wrist to forearm) nerves are increased at bilateral wrists (left 19.7 mm2, right 14.2 mm2). Wrist to forearm ratios of bilateral median nerves are increased (left 3.46, right 2.25). No other obvious lesion involving the adjacent bone or tendon is identified. No definite vascular abnormalities.  Impression: This is an abnormal study. The findings are most consistent with the following: Bilateral median mononeuropathy at or distal to the wrist, consistent with carpal tunnel syndrome. The findings are mild in degree electrically on the right and moderate in degree electrically on the left. No electrodiagnostic evidence of a right or left cervical (C5-T1) motor  radiculopathy.   ___________________________ Rommie Coats, MD    NCS+ Motor Nerve Results    Latency Amplitude F-Lat Segment Distance CV Comment  Site (ms) Norm (mV) Norm (ms)  (cm) (m/s) Norm   Left Median (APB) Motor  Wrist *4.2  < 3.9 *3.6  > 6.0        Elbow 7.0 - 3.5 -  Elbow-Wrist - -  > 50   Right Median (APB) Motor  Wrist 3.2  < 3.9 11.8  > 6.0        Elbow 7.4 - 11.2 -  Elbow-Wrist 27 64  > 50   Left Ulnar (ADM) Motor  Wrist 1.88  < 3.1 10.8  > 7.0        Bel elbow 4.8 - 8.0 -  Bel elbow-Wrist 20 69  > 50   Ab elbow 6.3 - 7.6 -  Ab elbow-Bel elbow 10 67 -   Right Ulnar (ADM) Motor  Wrist 1.70  < 3.1 8.0  > 7.0        Bel elbow 5.0 - 7.9 -  Bel elbow-Wrist 20 61  > 50   Ab elbow 6.8 - 7.2 -  Ab elbow-Bel elbow 10 56 -    Sensory Sites    Neg Peak Lat Amplitude (O-P) Segment Distance Velocity Comment  Site (ms) Norm (V) Norm  (cm) (ms)   Left Median Sensory  Wrist-Dig II *4.1  < 3.4 *14  > 20 Wrist-Dig II 13    Right Median Sensory  Wrist-Dig II *3.6  < 3.4 21  > 20 Wrist-Dig II 13    Left Radial Sensory  Forearm-Wrist 1.70  < 2.7 34  > 18 Forearm-Wrist 10    Right Radial Sensory  Forearm-Wrist 1.90  < 2.7 21  >  18 Forearm-Wrist 10    Left Ulnar Sensory  Wrist-Dig V 2.5  < 3.1 41  > 12 Wrist-Dig V 11    Right Ulnar Sensory  Wrist-Dig V 2.4  < 3.1 33  > 12 Wrist-Dig V 11     EMG+   Side Muscle Ins.Act Fibs Fasc Recrt Amp Dur Poly Activation Comment  Left FDI Nml Nml Nml Nml Nml Nml Nml Nml N/A  Left EIP Nml Nml Nml Nml Nml Nml Nml Nml N/A  Left Pronator teres Nml Nml Nml Nml Nml Nml Nml Nml N/A  Left APB Nml Nml Nml *2- *1+ *1+ *1+ Nml N/A  Left Biceps Nml Nml Nml Nml Nml Nml Nml Nml N/A  Left Triceps Nml Nml Nml Nml Nml Nml Nml Nml N/A  Left Deltoid Nml Nml Nml Nml Nml Nml Nml Nml N/A  Right FDI Nml Nml Nml Nml Nml Nml Nml Nml N/A  Right EIP Nml Nml Nml Nml Nml Nml Nml Nml N/A  Right Pronator teres Nml Nml Nml Nml Nml Nml Nml Nml N/A  Right APB Nml Nml  Nml Nml Nml Nml Nml Nml N/A  Right Biceps Nml Nml Nml Nml Nml Nml Nml Nml N/A  Right Triceps Nml Nml Nml Nml Nml Nml Nml Nml N/A  Right Deltoid Nml Nml Nml Nml Nml Nml Nml Nml N/A   Nerve Measurements   Site Area Segment Area Ratio Mobility Vascularity Comment   mm Norm   Norm     Left Median  Wrist *19.7  < 13.0         Forearm 5.7  < 10.7  Wrist - Forearm *3.46  < 1.50      Right Median  Wrist *14.2  < 13.0         Forearm 6.3  < 10.7  Wrist - Forearm *2.25  < 1.50          Waveforms:  Motor           Sensory

## 2023-08-11 DIAGNOSIS — Z111 Encounter for screening for respiratory tuberculosis: Secondary | ICD-10-CM | POA: Diagnosis not present

## 2023-08-11 DIAGNOSIS — K5900 Constipation, unspecified: Secondary | ICD-10-CM | POA: Diagnosis not present

## 2023-08-11 DIAGNOSIS — Z0184 Encounter for antibody response examination: Secondary | ICD-10-CM | POA: Diagnosis not present

## 2023-08-11 DIAGNOSIS — Z23 Encounter for immunization: Secondary | ICD-10-CM | POA: Diagnosis not present

## 2023-08-18 DIAGNOSIS — Z23 Encounter for immunization: Secondary | ICD-10-CM | POA: Diagnosis not present

## 2023-08-23 NOTE — Telephone Encounter (Signed)
 Please schedule her a phone visit with Ruthann Cover.   Thanks.

## 2023-09-02 ENCOUNTER — Ambulatory Visit: Admitting: Physician Assistant

## 2023-09-02 DIAGNOSIS — G5603 Carpal tunnel syndrome, bilateral upper limbs: Secondary | ICD-10-CM | POA: Diagnosis not present

## 2023-09-02 NOTE — Progress Notes (Addendum)
 Discussed patient's recent EMG results with her over the phone.  EMG  results show bilateral median mononeuropathies at or distal to the wrist.  Mild on the right and moderate on the left.  However patient continues to states that she has minimal symptoms in her left hand and it is primarily right-sided.  This has continued despite wearing a wrist brace and undergoing injections.  She would like to undergo right-sided ultrasound-guided carpal tunnel release at this time.  Risk and benefits of this procedure were discussed with her at length and she agrees to proceed.  We will work to get this scheduled as soon as possible.    History: This is a 43 year old female with bilateral hand numbness and tingling.   NCV & EMG Findings: Extensive electrodiagnostic evaluation of bilateral upper limbs shows: Left median sensory response shows prolonged distal peak latency (4.1 ms) and reduced amplitude (14 V). Right median sensory response shows prolonged distal peak latency (3.6 ms). Bilateral ulnar and radial sensory responses are within normal limits. Left median (APB) motor response shows prolonged distal onset latency (4.2 ms) and reduced amplitude (3.6 mV). Right median (APB) and bilateral ulnar (ADM) motor responses are within normal limits. Chronic motor axon loss changes without accompanying active denervation changes are seen in the left abductor pollicis brevis muscle.   Neuromuscular Ultrasound Findings: High frequency (4.0-16.0 MHz) B-mode, nonvascular ultrasound of the bilateral upper limbs shows: Cross sectional areas (CSA) of bilateral median (wrist to forearm) nerves are increased at bilateral wrists (left 19.7 mm2, right 14.2 mm2). Wrist to forearm ratios of bilateral median nerves are increased (left 3.46, right 2.25). No other obvious lesion involving the adjacent bone or tendon is identified. No definite vascular abnormalities.   Impression: This is an abnormal study. The findings are  most consistent with the following: Bilateral median mononeuropathy at or distal to the wrist, consistent with carpal tunnel syndrome. The findings are mild in degree electrically on the right and moderate in degree electrically on the left. No electrodiagnostic evidence of a right or left cervical (C5-T1) motor radiculopathy.  I connected with  CAMELLA SEIM on 09/02/23 via telephone and verified that I am speaking with the correct person using two identifiers. She was in her private residence and I was in clinic. We spoke for 10 minutes.   I discussed the limitations of evaluation and management by telemedicine. The patient expressed understanding and agreed to proceed.

## 2023-09-03 ENCOUNTER — Other Ambulatory Visit: Payer: Self-pay

## 2023-09-03 ENCOUNTER — Telehealth: Payer: Self-pay

## 2023-09-03 DIAGNOSIS — G5601 Carpal tunnel syndrome, right upper limb: Secondary | ICD-10-CM

## 2023-09-03 DIAGNOSIS — Z01818 Encounter for other preprocedural examination: Secondary | ICD-10-CM

## 2023-09-03 NOTE — Telephone Encounter (Signed)
 Please see below for information in regards to your upcoming surgery:  Planned surgery: Right Carpal Tunnel Release with Ultrasound Guidance ( CPT [id number], [id number])   Surgery date: 8/ 07/2023 at [facility] (Medical Mall: [address] [place], [place] [address]) - you will find out your arrival time the business day before your surgery.   Pre-op appointment at [facility] Pre-admit Testing: you will receive a call with a date/time for this appointment. If you are scheduled for an in person appointment, Pre-admit Testing is located on the first floor of the [facility] Arts building, [address], [address]. During this appointment, they will advise you which medications you can take the morning of surgery, and which medications you will need to hold for surgery. Labs (such as blood work, EKG) may be done at your pre-op appointment. You are not required to fast for these labs. Should you need to change your pre-op appointment, please call Pre-admit testing at [phone].   Common restrictions after surgery:  Do not use your surgical arm for 3 days after surgery. Avoid lifting objects heavier than 10 pounds for the first 6 weeks after surgery. Try to arrange for help from friends and family for these activities while you heal. Do not drive while taking prescription pain medication.   How to contact us :  If you have any questions/concerns before or after surgery, you can reach us  at [phone], or you can send a mychart message. We can be reached by phone or mychart 8am-4pm, Monday-Friday.  *Please note: Calls after 4pm are forwarded to a third party answering service. Mychart messages are not routinely monitored during evenings, weekends, and holidays. Please call our office to contact the answering service for urgent concerns during non-business hours.  If you have FMLA/disability paperwork, please drop it off or fax it to [phone], attention  Patty.   Appointments/FMLA & disability paperwork: Gerlean Kocher, & Maryann Smalls Registered Nurses/Surgery schedulers: Kendelyn & Storm Dulski Medical Assistants: Donnajean Fuse Physician Assistants: Ludwig Safer, PA-C, Anastacio Karvonen, PA-C & Lucetta Russel, PA-C Surgeons: Jodeen Munch, MD & Henderson Lock, MD   [facility] PREADMIT TESTING VISIT and SURGERY INFORMATION SHEET   Now that surgery has been scheduled you can anticipate several phone calls from [facility] services. A pharmacy technician will call you to verify your current list of medications taken at home.               The Pre-Service Center will call to verify your insurance information and to give you billing estimates and information.             The Preadmit Testing Office will be calling to schedule a visit to obtain information for the anesthesia team and provide instructions on preparation for surgery.  What can you expect for the Preadmit Testing Visit: Appointments may be scheduled in-person or by telephone.  If a telephone visit is scheduled, you may be asked to come into the office to have lab tests or other studies performed.   This visit will not be completed any greater than 14 days prior to your surgery.  If your surgery has been scheduled for a future date, please do not be alarmed if we have not contacted you to schedule an appointment more than a month prior to the surgery date.    Please be prepared to provide the following information during this appointment:            -Personal medical history                                               -  Medication and allergy list            -Any history of problems with anesthesia              -Recent lab work or diagnostic studies            -Please notify us  of any needs we should be aware of to provide the best care possible           -You will be provided with instructions on how to prepare for your surgery.    On The Day of Surgery:  You must  have a driver to take you home after surgery, you will be asked not to drive for 24 hours following surgery.  Taxi, [employer] and non-medical transport will not be acceptable means of transportation unless you have a responsible individual who will be traveling with you.  Visitors in the surgical area:   2 people will be able to visit you in your room once your preparation for surgery has been completed. During surgery, your visitors will be asked to wait in the Surgery Waiting Area.  It is not a requirement for them to stay, if they prefer to leave and come back.  Your visitor(s) will be given an update once the surgery has been completed.  No visitors are allowed in the initial recovery room to respect patient privacy and safety.  Once you are more awake and transfer to the secondary recovery area, or are transferred to an inpatient room, visitors will again be able to see you.  To respect and protect your privacy: We will ask on the day of surgery who your driver will be and what the contact number for that individual will be. We will ask if it is okay to share information with this individual, or if there is an alternative individual that we, or the surgeon, should contact to provide updates and information. If family or friends come to the surgical information desk requesting information about you, who you have not listed with us , no information will be given.   It may be helpful to designate someone as the main contact who will be responsible for updating your other friends and family.    PREADMIT TESTING OFFICE: [phone] SAME DAY SURGERY: [phone] We look forward to caring for you before and throughout the process of your surgery.

## 2023-09-11 DIAGNOSIS — K5900 Constipation, unspecified: Secondary | ICD-10-CM | POA: Diagnosis not present

## 2023-09-17 NOTE — Progress Notes (Signed)
 Referring Physician:  Redmon, Noelle, PA [address] [place],  [place] [address]  Primary Physician:  Elizabeth [name], [place]  History of Present Illness: 09/23/2023 Ms. Elizabeth [name] is here today with a chief complaint of bilateral hand numbness and tingling.  She states that she has had issues with numbness and tingling in her right hand for approximately 5 years and in her left hand for 1 to 2 years.  Her last EMG was 5 years ago.  She states that she has numbness tingling and pain in her first 4 digits of her right hand and worse in her 2nd and 3rd digits of her left hand.  This is accompanied with sharp pain with numbness and tingling.  She has been wearing braces at night and recently underwent injections in bilateral wrist which have helped her pain tremendously.  She comes today to establish care and discuss potential surgery for carpal tunnel symptoms.  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  She notices her symptoms are worse at night as well as when using a box cutter repeatedly for her job.  She denies any neck pain or neck pain that radiates down her arm.  Conservative measures:  Physical therapy:  has not participated in PT Multimodal medical therapy including regular antiinflammatories:  Prednisone, Ibuprofen  Injections: no epidural steroid injections  Past Surgery: none  Elizabeth [name] has no symptoms of cervical myelopathy.  The symptoms are causing a significant impact on the patient's life.   Review of Systems:  A 10 point review of systems is negative, except for the pertinent positives and negatives detailed in the HPI.  Past Medical History: Past Medical History:  Diagnosis Date   History of IBS    Trichomonal infection     Past Surgical History: Past Surgical History:  Procedure Laterality Date   CESAREAN SECTION      Allergies: Allergies as of 09/23/2023   (No Known Allergies)     Medications: Outpatient Encounter Medications as of 09/23/2023  Medication Sig   betamethasone , augmented, (DIPROLENE ) 0.05 % lotion Apply 60 mLs topically daily.   lubiprostone (AMITIZA) 8 MCG capsule Take 8 mcg by mouth 2 (two) times daily.   medroxyPROGESTERone  (DEPO-PROVERA ) 150 MG/ML injection Inject 150 mg into the muscle every 3 (three) months.   propranolol ER (INDERAL LA) 60 MG 24 hr capsule TAKE 1 CAPSULE BY MOUTH ONCE DAILY FOR ELEVATED HEART RATE   traZODone (DESYREL) 50 MG tablet    [DISCONTINUED] diclofenac  (VOLTAREN ) 50 MG EC tablet Take 50 mg by mouth 2 (two) times daily as needed.   [DISCONTINUED] lubiprostone (AMITIZA) 24 MCG capsule Take 24 mcg by mouth 2 (two) times daily with a meal.   [DISCONTINUED] PANTOPRAZOLE SODIUM PO Pantoprazole Sodium   No facility-administered encounter medications on file as of 09/23/2023.    Social History: Social History   Tobacco Use   Smoking status: Never   Smokeless tobacco: Never  Substance Use Topics   Alcohol use: No   Drug use: No    Family Medical History: No family history on file.  Physical Examination:  NEUROLOGICAL:  Positive Tinel of bilateral median nerves at the wrist, positive carpal compression test bilaterally.  No significant thenar or hypothenar wasting seen in her hands.  Strength: Side Biceps Triceps Deltoid Interossei Grip Wrist Ext. Wrist Flex.  R 5 5 5  4+ 5 5 5   L 5 5 5  4+ 5 5 5  Reflexes are 2+ and symmetric at the biceps, triceps, brachioradialis.    Bilateral upper extremity sensation is decreased in the median nerve distribution. Gait is normal.   No difficulty with tandem gait.   No evidence of dysmetria noted.  Medical Decision Making  Imaging: No recent imaging able to be reviewed.  Assessment and Plan: Ms. Elizabeth [name] is a pleasant 43 y.o. female is here today with a chief complaint of bilateral hand numbness and tingling.  She had a recent EMG which demonstrated bilateral carpal  tunnel syndrome.  She has had injections and wrist bracing.  We discussed that her right side is more symptomatic than her left at this time.  Wakes her up every night.  On physical examination she has this exam consistent with carpal tunnel syndrome.  Her EMG shows carpal tunnel syndrome.  At this point she is scheduled for a carpal tunnel decompression, on the right, we will do this with ultrasound guidance.  She like to go forward with the procedure.  We discussed risk and benefits of surgery.   Elizabeth MICAEL Sharps, MD Dept. of Neurosurgery

## 2023-09-18 DIAGNOSIS — Z23 Encounter for immunization: Secondary | ICD-10-CM | POA: Diagnosis not present

## 2023-09-22 DIAGNOSIS — K5900 Constipation, unspecified: Secondary | ICD-10-CM | POA: Insufficient documentation

## 2023-09-23 ENCOUNTER — Encounter: Payer: Self-pay | Admitting: Neurosurgery

## 2023-09-23 ENCOUNTER — Ambulatory Visit: Admitting: Neurosurgery

## 2023-09-23 VITALS — BP 110/78 | Ht 64.0 in | Wt 178.2 lb

## 2023-09-23 DIAGNOSIS — G5601 Carpal tunnel syndrome, right upper limb: Secondary | ICD-10-CM

## 2023-10-20 ENCOUNTER — Other Ambulatory Visit: Payer: Self-pay

## 2023-10-20 ENCOUNTER — Encounter
Admission: RE | Admit: 2023-10-20 | Discharge: 2023-10-20 | Disposition: A | Discharge status: Home / Self Care | Source: Ambulatory Visit | Attending: Neurosurgery | Admitting: Neurosurgery

## 2023-10-20 DIAGNOSIS — R000 Tachycardia, unspecified: Secondary | ICD-10-CM

## 2023-10-20 DIAGNOSIS — Z01812 Encounter for preprocedural laboratory examination: Secondary | ICD-10-CM

## 2023-10-20 HISTORY — DX: Constipation, unspecified: K59.00

## 2023-10-20 HISTORY — DX: Cardiac arrhythmia, unspecified: I49.9

## 2023-10-20 HISTORY — DX: Hemorrhage of anus and rectum: K62.5

## 2023-10-20 NOTE — Patient Instructions (Addendum)
 Your procedure is scheduled on: 10/27/23 - Tuesday Report to the Registration Desk on the 1st floor of the Medical Mall. To find out your arrival time, please call [phone] between 1PM - 3PM on: 10/26/23 - Monday If your arrival time is 6:00 am, do not arrive before that time as the Medical Mall entrance doors do not open until 6:00 am.  REMEMBER: Instructions that are not followed completely may result in serious medical risk, up to and including death; or upon the discretion of your surgeon and anesthesiologist your surgery may need to be rescheduled.  Do not eat food after midnight the night before surgery.  No gum chewing or hard candies.  You may however, drink CLEAR liquids up to 2 hours before you are scheduled to arrive for your surgery. Do not drink anything within 2 hours of your scheduled arrival time.  Clear liquids include: - water  - apple juice without pulp - gatorade (not RED colors) - black coffee or tea (Do NOT add milk or creamers to the coffee or tea) Do NOT drink anything that is not on this list.   One week prior to surgery: Stop Anti-inflammatories (NSAIDS) such as Advil , Aleve, Ibuprofen , Motrin , Naproxen, Naprosyn and Aspirin based products such as Excedrin, Goody's Powder, BC Powder. You may continue to take Tylenol  if needed for pain up until the day of surgery.  Stop ANY OVER THE COUNTER supplements until after surgery : Multivitamin  ON THE DAY OF SURGERY ONLY TAKE THESE MEDICATIONS WITH SIPS OF WATER:  propranolol ER (INDERAL LA) if needed   No Alcohol for 24 hours before or after surgery.  No Smoking including e-cigarettes for 24 hours before surgery.  No chewable tobacco products for at least 6 hours before surgery.  No nicotine patches on the day of surgery.  Do not use any recreational drugs for at least a week (preferably 2 weeks) before your surgery.  Please be advised that the combination of cocaine and anesthesia may have negative  outcomes, up to and including death. If you test positive for cocaine, your surgery will be cancelled.  On the morning of surgery brush your teeth with toothpaste and water, you may rinse your mouth with mouthwash if you wish. Do not swallow any toothpaste or mouthwash.  Use CHG Soap or wipes as directed on instruction sheet.  Do not wear jewelry, make-up, hairpins, clips or nail polish.  For welded (permanent) jewelry: bracelets, anklets, waist bands, etc.  Please have this removed prior to surgery.  If it is not removed, there is a chance that hospital personnel will need to cut it off on the day of surgery.  Do not wear lotions, powders, or perfumes.   Do not shave body hair from the neck down 48 hours before surgery.  Contact lenses, hearing aids and dentures may not be worn into surgery.  Do not bring valuables to the hospital. [facility] is not responsible for any missing/lost belongings or valuables.   Notify your doctor if there is any change in your medical condition (cold, fever, infection).  Wear comfortable clothing (specific to your surgery type) to the hospital.  After surgery, you can help prevent lung complications by doing breathing exercises.  Take deep breaths and cough every 1-2 hours. Your doctor may order a device called an Incentive Spirometer to help you take deep breaths.  When coughing or sneezing, hold a pillow firmly against your incision with both hands. This is called "splinting." Doing this helps  protect your incision. It also decreases belly discomfort.  If you are being admitted to the hospital overnight, leave your suitcase in the car. After surgery it may be brought to your room.  In case of increased patient census, it may be necessary for you, the patient, to continue your postoperative care in the Same Day Surgery department.  If you are being discharged the day of surgery, you will not be allowed to drive home. You will need a responsible  individual to drive you home and stay with you for 24 hours after surgery.   If you are taking public transportation, you will need to have a responsible individual with you.  Please call the Pre-admissions Testing Dept. at [phone] if you have any questions about these instructions.  Surgery Visitation Policy:  Patients having surgery or a procedure may have two visitors.  Children under the age of 17 must have an adult with them who is not the patient.  Inpatient Visitation:    Visiting hours are 7 a.m. to 8 p.m. Up to four visitors are allowed at one time in a patient room. The visitors may rotate out with other people during the day.  One visitor age 83 or older may stay with the patient overnight and must be in the room by 8 p.m.   Merchandiser, retail to address health-related social needs:  [email or web address]    Preparing for Surgery with CHLORHEXIDINE GLUCONATE (CHG) Soap  Chlorhexidine Gluconate (CHG) Soap  o An antiseptic cleaner that kills germs and bonds with the skin to continue killing germs even after washing  o Used for showering the night before surgery and morning of surgery  Before surgery, you can play an important role by reducing the number of germs on your skin.  CHG (Chlorhexidine gluconate) soap is an antiseptic cleanser which kills germs and bonds with the skin to continue killing germs even after washing.  Please do not use if you have an allergy to CHG or antibacterial soaps. If your skin becomes reddened/irritated stop using the CHG.  1. Shower the NIGHT BEFORE SURGERY and the MORNING OF SURGERY with CHG soap.  2. If you choose to wash your hair, wash your hair first as usual with your normal shampoo.  3. After shampooing, rinse your hair and body thoroughly to remove the shampoo.  4. Use CHG as you would any other liquid soap. You can apply CHG directly to the skin and wash gently with a scrungie or a clean  washcloth.  5. Apply the CHG soap to your body only from the neck down. Do not use on open wounds or open sores. Avoid contact with your eyes, ears, mouth, and genitals (private parts). Wash face and genitals (private parts) with your normal soap.  6. Wash thoroughly, paying special attention to the area where your surgery will be performed.  7. Thoroughly rinse your body with warm water.  8. Do not shower/wash with your normal soap after using and rinsing off the CHG soap.  9. Pat yourself dry with a clean towel.  10. Wear clean pajamas to bed the night before surgery.  12. Place clean sheets on your bed the night of your first shower and do not sleep with pets.  13. Shower again with the CHG soap on the day of surgery prior to arriving at the hospital.  14. Do not apply any deodorants/lotions/powders.  15. Please wear clean clothes to the hospital.

## 2023-10-21 DIAGNOSIS — Z3042 Encounter for surveillance of injectable contraceptive: Secondary | ICD-10-CM | POA: Diagnosis not present

## 2023-10-23 ENCOUNTER — Encounter
Admission: RE | Admit: 2023-10-23 | Discharge: 2023-10-23 | Disposition: A | Discharge status: Home / Self Care | Source: Ambulatory Visit | Attending: Neurosurgery | Admitting: Neurosurgery

## 2023-10-23 DIAGNOSIS — R000 Tachycardia, unspecified: Secondary | ICD-10-CM | POA: Insufficient documentation

## 2023-10-23 DIAGNOSIS — Z01818 Encounter for other preprocedural examination: Secondary | ICD-10-CM | POA: Diagnosis not present

## 2023-10-23 LAB — CBC
HCT: 39.5 % (ref 36.0–46.0)
Hemoglobin: 13 g/dL (ref 12.0–15.0)
MCH: 27.7 pg (ref 26.0–34.0)
MCHC: 32.9 g/dL (ref 30.0–36.0)
MCV: 84 fL (ref 80.0–100.0)
Platelets: 218 K/uL (ref 150–400)
RBC: 4.7 MIL/uL (ref 3.87–5.11)
RDW: 14.2 % (ref 11.5–15.5)
WBC: 6.7 K/uL (ref 4.0–10.5)
nRBC: 0 % (ref 0.0–0.2)

## 2023-10-23 LAB — BASIC METABOLIC PANEL WITH GFR
Anion gap: 11 (ref 5–15)
BUN: 19 mg/dL (ref 6–20)
CO2: 20 mmol/L — ABNORMAL LOW (ref 22–32)
Calcium: 9.4 mg/dL (ref 8.9–10.3)
Chloride: 107 mmol/L (ref 98–111)
Creatinine, Ser: 0.96 mg/dL (ref 0.44–1.00)
GFR, Estimated: >60 mL/min (ref >60)
Glucose, Bld: 95 mg/dL (ref 70–99)
Potassium: 3.5 mmol/L (ref 3.5–5.1)
Sodium: 138 mmol/L (ref 135–145)

## 2023-10-26 ENCOUNTER — Ambulatory Visit: Payer: Self-pay | Admitting: Neurosurgery

## 2023-10-26 DIAGNOSIS — G5601 Carpal tunnel syndrome, right upper limb: Secondary | ICD-10-CM

## 2023-10-26 NOTE — H&P (Signed)
 Delete, entered in duplicate

## 2023-10-27 ENCOUNTER — Encounter: Payer: Self-pay | Admitting: Neurosurgery

## 2023-10-27 ENCOUNTER — Ambulatory Visit
Admission: RE | Admit: 2023-10-27 | Discharge: 2023-10-27 | Disposition: A | Discharge status: Home / Self Care | Attending: Neurosurgery | Admitting: Neurosurgery

## 2023-10-27 ENCOUNTER — Ambulatory Visit: Payer: Self-pay | Admitting: Urgent Care

## 2023-10-27 ENCOUNTER — Ambulatory Visit: Payer: Self-pay | Admitting: Neurosurgery

## 2023-10-27 ENCOUNTER — Ambulatory Visit: Payer: Self-pay | Admitting: Anesthesiology

## 2023-10-27 ENCOUNTER — Other Ambulatory Visit: Payer: Self-pay

## 2023-10-27 ENCOUNTER — Encounter
Admission: RE | Disposition: A | Discharge status: Home / Self Care | Payer: Self-pay | Source: Home / Self Care | Attending: Neurosurgery

## 2023-10-27 DIAGNOSIS — Z01818 Encounter for other preprocedural examination: Secondary | ICD-10-CM

## 2023-10-27 DIAGNOSIS — G5601 Carpal tunnel syndrome, right upper limb: Secondary | ICD-10-CM | POA: Diagnosis not present

## 2023-10-27 DIAGNOSIS — Z01812 Encounter for preprocedural laboratory examination: Secondary | ICD-10-CM

## 2023-10-27 HISTORY — PX: CARPAL TUNNEL RELEASE: SHX101

## 2023-10-27 LAB — POCT PREGNANCY, URINE: Preg Test, Ur: NEGATIVE

## 2023-10-27 SURGERY — CARPAL TUNNEL RELEASE
Anesthesia: General | Laterality: Right

## 2023-10-27 MED ORDER — MIDAZOLAM HCL 2 MG/2ML IJ SOLN
INTRAMUSCULAR | Status: AC
  Filled 2023-10-27: qty 2

## 2023-10-27 MED ORDER — ACETAMINOPHEN 10 MG/ML IV SOLN: 1000.0000 mg | Freq: Once | INTRAVENOUS | Status: DC | PRN

## 2023-10-27 MED ORDER — ONDANSETRON HCL 4 MG/2ML IJ SOLN
INTRAMUSCULAR | Status: AC
  Filled 2023-10-27: qty 2

## 2023-10-27 MED ORDER — HYDROCODONE-ACETAMINOPHEN 5-325 MG PO TABS: 1.0000 | ORAL_TABLET | Freq: Four times a day (QID) | ORAL | 0 refills | Status: AC | PRN

## 2023-10-27 MED ORDER — ORAL CARE MOUTH RINSE: 15.0000 mL | Freq: Once | OROMUCOSAL | Status: AC

## 2023-10-27 MED ORDER — CEFAZOLIN SODIUM-DEXTROSE 2-4 GM/100ML-% IV SOLN
2.0000 g | Freq: Once | INTRAVENOUS | Status: AC
  Administered 2023-10-27: 2 g via INTRAVENOUS

## 2023-10-27 MED ORDER — FENTANYL CITRATE (PF) 100 MCG/2ML IJ SOLN
INTRAMUSCULAR | Status: AC
  Filled 2023-10-27: qty 2

## 2023-10-27 MED ORDER — PROPOFOL 10 MG/ML IV BOLUS
INTRAVENOUS | Status: AC
  Filled 2023-10-27: qty 40

## 2023-10-27 MED ORDER — OXYCODONE HCL 5 MG PO TABS: 5.0000 mg | ORAL_TABLET | Freq: Once | ORAL | Status: DC | PRN

## 2023-10-27 MED ORDER — 0.9 % SODIUM CHLORIDE (POUR BTL) OPTIME
TOPICAL | Status: DC | PRN
  Administered 2023-10-27: 100 mL

## 2023-10-27 MED ORDER — FENTANYL CITRATE (PF) 100 MCG/2ML IJ SOLN
INTRAMUSCULAR | Status: DC | PRN
  Administered 2023-10-27 (×2): 25 ug via INTRAVENOUS

## 2023-10-27 MED ORDER — LIDOCAINE HCL (PF) 2 % IJ SOLN
INTRAMUSCULAR | Status: AC
  Filled 2023-10-27: qty 5

## 2023-10-27 MED ORDER — LIDOCAINE HCL URETHRAL/MUCOSAL 2 % EX GEL
CUTANEOUS | Status: AC
Start: 2023-10-27 — End: 2023-10-27
  Filled 2023-10-27: qty 6

## 2023-10-27 MED ORDER — MIDAZOLAM HCL 2 MG/2ML IJ SOLN
INTRAMUSCULAR | Status: DC | PRN
  Administered 2023-10-27: 2 mg via INTRAVENOUS

## 2023-10-27 MED ORDER — LIDOCAINE HCL (PF) 2 % IJ SOLN
INTRAMUSCULAR | Status: DC | PRN
  Administered 2023-10-27: 60 mg via INTRADERMAL

## 2023-10-27 MED ORDER — PROPOFOL 500 MG/50ML IV EMUL
INTRAVENOUS | Status: DC | PRN
Start: 2023-10-27 — End: 2023-10-27
  Administered 2023-10-27: 125 ug/kg/min via INTRAVENOUS

## 2023-10-27 MED ORDER — CEFAZOLIN SODIUM 1 G IJ SOLR
INTRAMUSCULAR | Status: AC
Start: 2023-10-27 — End: 2023-10-27
  Filled 2023-10-27: qty 20

## 2023-10-27 MED ORDER — DEXAMETHASONE SODIUM PHOSPHATE 10 MG/ML IJ SOLN
INTRAMUSCULAR | Status: DC | PRN
Start: 2023-10-27 — End: 2023-10-27
  Administered 2023-10-27: 10 mg via INTRAVENOUS

## 2023-10-27 MED ORDER — CHLORHEXIDINE GLUCONATE 0.12 % MT SOLN
15.0000 mL | Freq: Once | OROMUCOSAL | Status: AC
  Administered 2023-10-27: 15 mL via OROMUCOSAL

## 2023-10-27 MED ORDER — SEVOFLURANE IN SOLN
RESPIRATORY_TRACT | Status: AC
  Filled 2023-10-27: qty 250

## 2023-10-27 MED ORDER — OXYCODONE HCL 5 MG/5ML PO SOLN: 5.0000 mg | Freq: Once | ORAL | Status: DC | PRN

## 2023-10-27 MED ORDER — LACTATED RINGERS IV SOLN: INTRAVENOUS | Status: DC

## 2023-10-27 MED ORDER — DEXAMETHASONE SODIUM PHOSPHATE 10 MG/ML IJ SOLN
INTRAMUSCULAR | Status: AC
  Filled 2023-10-27: qty 1

## 2023-10-27 MED ORDER — ONDANSETRON HCL 4 MG/2ML IJ SOLN
INTRAMUSCULAR | Status: DC | PRN
Start: 2023-10-27 — End: 2023-10-27
  Administered 2023-10-27: 4 mg via INTRAVENOUS

## 2023-10-27 MED ORDER — LIDOCAINE HCL (PF) 1 % IJ SOLN
INTRAMUSCULAR | Status: AC
  Filled 2023-10-27: qty 90

## 2023-10-27 MED ORDER — LIDOCAINE HCL 1 % IJ SOLN
INTRAMUSCULAR | Status: DC | PRN
  Administered 2023-10-27: 4 mL

## 2023-10-27 MED ORDER — CEFAZOLIN SODIUM-DEXTROSE 2-4 GM/100ML-% IV SOLN
INTRAVENOUS | Status: AC
Start: 2023-10-27 — End: 2023-10-27
  Filled 2023-10-27: qty 100

## 2023-10-27 MED ORDER — FENTANYL CITRATE (PF) 100 MCG/2ML IJ SOLN: 25.0000 ug | INTRAMUSCULAR | Status: DC | PRN

## 2023-10-27 MED ORDER — CHLORHEXIDINE GLUCONATE 0.12 % MT SOLN
OROMUCOSAL | Status: AC
  Filled 2023-10-27: qty 15

## 2023-10-27 MED ORDER — DROPERIDOL 2.5 MG/ML IJ SOLN: 0.6250 mg | Freq: Once | INTRAMUSCULAR | Status: DC | PRN

## 2023-10-27 MED ORDER — PROPOFOL 1000 MG/100ML IV EMUL
INTRAVENOUS | Status: AC
  Filled 2023-10-27: qty 100

## 2023-10-27 SURGICAL SUPPLY — 19 items
BNDG ADH 1X3 SHEER STRL LF (GAUZE/BANDAGES/DRESSINGS) ×2 IMPLANT
BNDG ADH 2 X3.75 FABRIC TAN LF (GAUZE/BANDAGES/DRESSINGS) IMPLANT
BRUSH SCRUB EZ 4% CHG (MISCELLANEOUS) ×2 IMPLANT
CHLORAPREP W/TINT 26 (MISCELLANEOUS) ×2 IMPLANT
COVER PROBE FLX POLY STRL (MISCELLANEOUS) ×2 IMPLANT
DERMABOND ADVANCED .7 DNX12 (GAUZE/BANDAGES/DRESSINGS) ×2 IMPLANT
DRAPE EXTREMITY 106X87X128.5 (DRAPES) ×2 IMPLANT
DRAPE IMP U-DRAPE 54X76 (DRAPES) ×2 IMPLANT
DRAPE SHEET LG 3/4 BI-LAMINATE (DRAPES) ×2 IMPLANT
GLOVE BIOGEL PI IND STRL 8 (GLOVE) ×2 IMPLANT
GLOVE SRG 8 PF TXTR STRL LF DI (GLOVE) ×2 IMPLANT
GLOVE SURG SYN 7.5 PF PI (GLOVE) ×2 IMPLANT
GOWN SRG XL LVL 3 NONREINFORCE (GOWNS) ×2 IMPLANT
KIT TURNOVER KIT A (KITS) ×2 IMPLANT
NDL HYPO 25X1 1.5 SAFETY (NEEDLE) ×2 IMPLANT
NEEDLE HYPO 25X1 1.5 SAFETY (NEEDLE) ×1 IMPLANT
NS IRRIG 500ML POUR BTL (IV SOLUTION) ×2 IMPLANT
PACK BASIN MINOR ARMC (MISCELLANEOUS) ×2 IMPLANT
ULTRAGLIDE CTR (BLADE) ×2 IMPLANT

## 2023-10-27 NOTE — Anesthesia Postprocedure Evaluation (Signed)
 Anesthesia Post Note  Patient: Elizabeth [name]  Procedure(s) Performed: CARPAL TUNNEL RELEASE (Right)  Patient location during evaluation: PACU Anesthesia Type: General Level of consciousness: awake and alert Pain management: pain level controlled Vital Signs Assessment: post-procedure vital signs reviewed and stable Respiratory status: spontaneous breathing, nonlabored ventilation and respiratory function stable Cardiovascular status: blood pressure returned to baseline and stable Postop Assessment: no apparent nausea or vomiting Anesthetic complications: no   No notable events documented.   Last Vitals:  Vitals:   10/27/23 0815 10/27/23 0829  BP:  112/84  Pulse:  65  Resp:  17  Temp: 36.8 C (!) 36 C  SpO2:  95%    Last Pain:  Vitals:   10/27/23 0829  TempSrc: Temporal  PainSc: 0-No pain                 [name]

## 2023-10-27 NOTE — H&P (Signed)
 **Chief Complaint:**   Carpal tunnel syndrome - right hand.  **History of Present Illness:**   Elizabeth [name] is a 43 y.o.-year-old female presenting with symptoms consistent with carpal tunnel syndrome, including numbness, tingling, and/or pain in the median nerve distribution. Symptoms have persisted for 5 years and are worsening.    Conservative treatments attempted include:   - Night splinting   - NSAIDs   - Activity modification   - Corticosteroid injection These have provided minimal relief.   Electrodiagnostic studies confirm median nerve compression at the wrist.  Past Medical History:  Diagnosis Date   Constipation    Dysrhythmia    History of IBS    Rectal bleeding    Trichomonal infection    Past Surgical History:  Procedure Laterality Date   BREAST SURGERY Right    breast biopsy   CESAREAN SECTION     Colonoscopy 2009-hyperplastic polyps       **Medications:**   betamethasone  (augmented) lubiprostone medroxyPROGESTERone  MULTIVITAMIN ADULT PO propranolol ER traZODone Vitamin D-3 Caps   **Allergies:**   No Known Allergies  **Social History:**    reports that she has never smoked. She has never used smokeless tobacco. She reports that she does not drink alcohol and does not use drugs.  **Family History:**   History reviewed. No pertinent family history.   **Review of Systems:**   Negative except as noted in HPI.  **Physical Exam:**   - **General:** Well-appearing, no acute distress.   - **Heart and Lungs are clear - **Extremities:** Thenar muscle atrophy absent.   - **Neurologic:**     - Positive Tinel's sign     - Positive Phalen's test, positive carpal compression test   - Decreased sensation in median nerve distribution     - Weakness in thumb opposition present  **Impression:**   Carpal tunnel syndrome, right.   Indication for surgical intervention based on:   - Persistent symptoms 5 years  - Failure of conservative management   -  Confirmatory electrodiagnostic testing  **Plan:**   Proceed with carpal tunnel release surgery using ultrasound guided technique.   Discussed risks (infection, nerve injury, incomplete relief), benefits, and alternatives.   Informed consent obtained.  **Preoperative Clearance:**   Patient is medically optimized for surgery.   BP 116/81   Pulse 69   Temp (!) 96.6 F (35.9 C)   Resp 16   Ht 5' 4 (1.626 m)   Wt 80.9 kg   SpO2 99%   BMI 30.61 kg/m    Recent Results (from the past 2160 hours)  CBC     Status: None   Collection Time: 10/23/23  1:02 PM  Result Value Ref Range   WBC 6.7 4.0 - 10.5 K/uL   RBC 4.70 3.87 - 5.11 MIL/uL   Hemoglobin 13.0 12.0 - 15.0 g/dL   HCT 60.4 63.9 - 53.9 %   MCV 84.0 80.0 - 100.0 fL   MCH 27.7 26.0 - 34.0 pg   MCHC 32.9 30.0 - 36.0 g/dL   RDW 85.7 88.4 - 84.4 %   Platelets 218 150 - 400 K/uL   nRBC 0.0 0.0 - 0.2 %    Comment: Performed at [facility], [address], [place], [place] [address]  Basic metabolic panel     Status: Abnormal   Collection Time: 10/23/23  1:02 PM  Result Value Ref Range   Sodium 138 135 - 145 mmol/L   Potassium 3.5 3.5 - 5.1 mmol/L   Chloride 107 98 - 111  mmol/L   CO2 20 (L) 22 - 32 mmol/L   Glucose, Bld 95 70 - 99 mg/dL    Comment: Glucose reference range applies only to samples taken after fasting for at least 8 hours.   BUN 19 6 - 20 mg/dL   Creatinine, Ser 9.03 0.44 - 1.00 mg/dL   Calcium 9.4 8.9 - 89.6 mg/dL   GFR, Estimated >39 >39 mL/min    Comment: (NOTE) Calculated using the CKD-EPI Creatinine Equation (2021)    Anion gap 11 5 - 15    Comment: Performed at [facility], [address] [place], [place] [address]  Pregnancy, urine POC     Status: None   Collection Time: 10/27/23  6:15 AM  Result Value Ref Range   Preg Test, Ur NEGATIVE NEGATIVE    Comment:        THE SENSITIVITY OF THIS METHODOLOGY IS >20 mIU/mL.

## 2023-10-27 NOTE — Op Note (Signed)
 Indications: Patient with a history of median neuropathy at the wrist with hand weakness refractory to conservative management.  Findings: Severe compression of the median nerve at the transverse carpal ligament  Preoperative Diagnosis:G56.01 Carpal tunnel syndrome on right  Postoperative Diagnosis: G56.01 Carpal tunnel syndrome on right   Postoperative Diagnosis: same   EBL: Minimal IVF: See anesthesia report Drains: none Disposition:Stable to PACU Complications: none  No foley catheter was placed.   Preoperative Note: patient with a history of progressive right median neuropathy with hand weakness refractory to conservative management.  They had tried rest, padding, and watchful waiting but had continued progressive symptoms.  Given the progression of her median neuropathy plan was made for median nerve decompression  Risk of surgery is discussed and include: Infection, bleeding, wound healing issues, pillar pain nerve injury, pain, failure to relieve the symptoms, need for further surgery.  Procedure:  1) right sided carpal tunnel decompression with ultrasound guidance   Procedure: After obtaining informed consent, the patient taken to the operating room, placed in supine position, monitored anesthesia care was induced.  They were given preoperative antibiotics.  Prepped and draped in the usual fashion.  Comprehensive timeout was performed verifying the patient's name, MRN, planned procedure.  An ultrasound was used with a sterile probe.  We used this to mark out our safety points including the interval between the ulnar artery and median nerve.  We identified the motor branch as well as the first sensory branch which were both in safe position.  We also identified the vascular arcade which was in safe positioning as well.  At this point we placed a block with Marcaine without epinephrine .  We blocked the skin where the incision would be, the superficial sensory median nerve, as  well as the transverse carpal ligament.  Under ultrasound guidance we utilized the local anesthetic to perform a hydrodissection of the nerve from the transverse carpal ligament.  We then prepped the sonexs ultra CTR knife on the back table while the anesthetic set in.  We performed a small linear incision approximately 2 to 3 mm.  We then utilized a Statistician under ultrasound guidance to identify the underside of the transverse carpal ligament.  The fat and connective tissue was dissected off the underside.  We could feel that this was quite thickened and calcified.  Causing severe compression.  Once we had a clear tract we then placed the ultrasound-guided knife into the incision and advanced it through the carpal tunnel.  We verified the safety zones including the median nerve which did not significantly cross over the knife.  We are able to see the first sensory branch which was not crossing the knife.  The artery was also in a safe place.  At this point we divided the transverse carpal ligament under ultrasound guidance, to get a full release it took 2 passes.  The nerve relaxed laterally and was well decompressed.  After the 2 passes we placed a [place] 4 into the wound and were able to feel a complete dissection of the transverse carpal ligament.  We then irrigated, we got meticulous hemostasis.  Skin glue was placed on the incision and a Band-Aid was placed on top once this was dried.  No immediate complications.  Sponge and pattie counts were correct at the end of the procedure.   I performed the procedure without an assistant surgeon  Carroll Clamp, MD/MSCR

## 2023-10-27 NOTE — Anesthesia Preprocedure Evaluation (Signed)
 Anesthesia Evaluation  Patient identified by MRN, date of birth, ID band Patient awake    Reviewed: Allergy & Precautions, H&P , NPO status , Patient's Chart, lab work & pertinent test results  Airway Mallampati: II  TM Distance: >3 FB Neck ROM: full    Dental no notable dental hx.    Pulmonary neg pulmonary ROS   Pulmonary exam normal        Cardiovascular Normal cardiovascular exam+ dysrhythmias      Neuro/Psych negative neurological ROS  negative psych ROS   GI/Hepatic negative GI ROS, Neg liver ROS,,,  Endo/Other  negative endocrine ROS    Renal/GU negative Renal ROS  negative genitourinary   Musculoskeletal   Abdominal  (+) + obese  Peds  Hematology negative hematology ROS (+)   Anesthesia Other Findings Past Medical History: No date: Constipation No date: Dysrhythmia No date: History of IBS No date: Rectal bleeding No date: Trichomonal infection  Past Surgical History: No date: BREAST SURGERY; Right     Comment:  breast biopsy No date: CESAREAN SECTION No date: Colonoscopy 2009-hyperplastic polyps  BMI    Body Mass Index: 30.61 kg/m      Reproductive/Obstetrics negative OB ROS                              Anesthesia Physical Anesthesia Plan  ASA: 2  Anesthesia Plan: General   Post-op Pain Management: Minimal or no pain anticipated   Induction: Intravenous  PONV Risk Score and Plan: Propofol  infusion and TIVA  Airway Management Planned: Natural Airway and Nasal Cannula  Additional Equipment:   Intra-op Plan:   Post-operative Plan:   Informed Consent: I have reviewed the patients History and Physical, chart, labs and discussed the procedure including the risks, benefits and alternatives for the proposed anesthesia with the patient or authorized representative who has indicated his/her understanding and acceptance.     Dental Advisory Given  Plan  Discussed with: CRNA and Surgeon  Anesthesia Plan Comments:          Anesthesia Quick Evaluation

## 2023-10-27 NOTE — Transfer of Care (Signed)
 Immediate Anesthesia Transfer of Care Note  Patient: Elizabeth [name]  Procedure(s) Performed: CARPAL TUNNEL RELEASE (Right)  Patient Location: PACU  Anesthesia Type:MAC  Level of Consciousness: awake, alert , and oriented  Airway & Oxygen Therapy: Patient Spontanous Breathing  Post-op Assessment: Report given to RN and Post -op Vital signs reviewed and stable  Post vital signs: Reviewed and stable  Last Vitals:  Vitals Value Taken Time  BP 107/76 10/27/23 07:46  Temp 36.2 C 10/27/23 07:46  Pulse 82 10/27/23 07:49  Resp 25 10/27/23 07:49  SpO2 95 % 10/27/23 07:49  Vitals shown include unfiled device data.  Last Pain:  Vitals:   10/27/23 0618  PainSc: 0-No pain         Complications: No notable events documented.

## 2023-10-27 NOTE — Discharge Instructions (Addendum)
 Your surgeon has performed an operation on the nerves in your upper extremity. Many times, patients feel better immediately after surgery and can "overdo it." Even if you feel well, it is important that you follow these activity guidelines. If you do not let your nerve heal properly from the surgery, you can increase the chance of return of your symptoms. The following are instructions to help in your recovery once you have been discharged from the hospital.  It is normal for your nerves to feel increased "tingling" after surgery or a sensation that the nerve is "waking up".  This generally will resolve in a short period of time, within 1 to 2 weeks.  * It is ok to take NSAIDs after surgery.  Activity    Increase physical activity slowly as tolerated.  Taking short walks is encouraged, but avoid strenuous exercise. Do not jog, run, bicycle, lift weights, or participate in any other exercises unless specifically allowed by your doctor. Avoid prolonged sitting, including car rides.  For the first few days after surgery, avoid any use of the extremity more than a glass of water or for eating.  Specific instructions were given to you based off of your specific procedure.  You should not drive until no longer taking any new pain medication.  You may shower three days after your surgery.  After showering, lightly dab your incision dry. Do not take a tub bath or go swimming for 3 weeks, or until approved by your doctor at your follow-up appointment.  If you smoke, we strongly recommend that you quit.  Smoking has been proven to interfere with normal healing in your back and will dramatically reduce the success rate of your surgery. Please contact [facility] (800-QUIT-NOW) and use the resources at www.[facility].com for assistance in stopping smoking.  Surgical Incision   If you have a dressing on your incision, you may remove it three days after your surgery. Keep your incision area clean and dry.  Your  sutures are under your skin and your wound is covered with surgical glue.  The glue should begin to peel away within about a week.   Diet            You may return to your usual diet. Be sure to stay hydrated.  When to Contact us  Although your surgery and recovery will likely be uneventful, you may have some residual numbness, aches, and pains in your back and/or legs. This is normal and should improve in the next few weeks.  However, should you experience any of the following, contact us immediately: New numbness or weakness Pain that is progressively getting worse, and is not relieved by your pain medications or rest Bleeding, redness, swelling, pain, or drainage from surgical incision Chills or flu-like symptoms Fever greater than 101.0 F (38.3 C) Problems with bowel or bladder functions Difficulty breathing or shortness of breath Warmth, tenderness, or swelling in your calf  Contact Information How to contact us:  If you have any questions/concerns before or after surgery, you can reach [place] at [phone], or you can send a mychart message. We can be reached by phone or mychart 8am-4pm, Monday-Friday.  *Please note: Calls after 4pm are forwarded to a third party answering service. Mychart messages are not routinely monitored during evenings, weekends, and holidays. Please call our office to contact the answering service for urgent concerns during non-business hours.

## 2023-10-28 ENCOUNTER — Encounter: Payer: Self-pay | Admitting: Neurosurgery

## 2023-11-04 DIAGNOSIS — K5900 Constipation, unspecified: Secondary | ICD-10-CM | POA: Diagnosis not present

## 2023-11-04 DIAGNOSIS — G4700 Insomnia, unspecified: Secondary | ICD-10-CM | POA: Diagnosis not present

## 2023-11-04 DIAGNOSIS — R002 Palpitations: Secondary | ICD-10-CM | POA: Diagnosis not present

## 2023-11-04 DIAGNOSIS — Z0000 Encounter for general adult medical examination without abnormal findings: Secondary | ICD-10-CM | POA: Diagnosis not present

## 2023-11-05 DIAGNOSIS — L218 Other seborrheic dermatitis: Secondary | ICD-10-CM | POA: Diagnosis not present

## 2023-11-05 DIAGNOSIS — L678 Other hair color and hair shaft abnormalities: Secondary | ICD-10-CM | POA: Diagnosis not present

## 2023-11-09 ENCOUNTER — Encounter: Payer: Self-pay | Admitting: Physician Assistant

## 2023-11-09 ENCOUNTER — Ambulatory Visit (INDEPENDENT_AMBULATORY_CARE_PROVIDER_SITE_OTHER): Admitting: Physician Assistant

## 2023-11-09 VITALS — BP 118/72 | Temp 99.3°F | Ht 64.0 in | Wt 178.0 lb

## 2023-11-09 DIAGNOSIS — G5603 Carpal tunnel syndrome, bilateral upper limbs: Secondary | ICD-10-CM

## 2023-11-09 DIAGNOSIS — G5601 Carpal tunnel syndrome, right upper limb: Secondary | ICD-10-CM

## 2023-11-09 DIAGNOSIS — Z09 Encounter for follow-up examination after completed treatment for conditions other than malignant neoplasm: Secondary | ICD-10-CM

## 2023-11-09 NOTE — Progress Notes (Signed)
   REFERRING PHYSICIAN:  Redmon, Noelle, [place] [address] [place],  [place] [address]  DOS: 10/27/23  HISTORY OF PRESENT ILLNESS: Elizabeth [name] is 2 weels status post R US  guided CTR. Overall, she is doing very well and her numbness and tingling has largely resolved.  She does have some mild pain in her palm.  PHYSICAL EXAMINATION:  NEUROLOGICAL:  General: In no acute distress.   Awake, alert, oriented to person, place, and time.  Pupils equal round and reactive to light.  Facial tone is symmetric.    Strength: Side Biceps Triceps Deltoid Interossei Grip Wrist Ext. Wrist Flex.  R 5 5 5 5 5 5 5   L 5 5 5 5 5 5 5    Incision c/d/I  Imaging:  No new imaging.  Assessment / Plan: Elizabeth [name] is doing well after right ultrasound-guided carpal tunnel release on 10/27/2023. We discussed activity escalation and she would like to remain out of work for the next month secondary to the heavy lifting requirement of her job.  Plan for her to come back in approximately1 month for 6-week postop visit.  Happy to answer any questions or in the meantime.    Advised to contact the office if any questions or concerns arise.   Lyle Decamp PA-C Dept of Neurosurgery

## 2023-12-07 ENCOUNTER — Ambulatory Visit (INDEPENDENT_AMBULATORY_CARE_PROVIDER_SITE_OTHER): Admitting: Neurosurgery

## 2023-12-07 ENCOUNTER — Encounter: Payer: Self-pay | Admitting: Neurosurgery

## 2023-12-07 VITALS — BP 126/84 | Temp 99.1°F | Ht 64.0 in | Wt 184.2 lb

## 2023-12-07 DIAGNOSIS — G5601 Carpal tunnel syndrome, right upper limb: Secondary | ICD-10-CM

## 2023-12-07 DIAGNOSIS — Z09 Encounter for follow-up examination after completed treatment for conditions other than malignant neoplasm: Secondary | ICD-10-CM

## 2023-12-07 NOTE — Progress Notes (Signed)
    REFERRING PHYSICIAN:  Redmon, Noelle, Pa [address] [place],  [place] [address]  DOS: 10/27/23  HISTORY OF PRESENT ILLNESS: Elizabeth [name] is 6 weels status post R US  guided CTR. Overall, she is doing very well and her numbness and tingling has largely resolved.  She no longer gets any nighttime symptoms.  She does have some wrist soreness with certain activities.  PHYSICAL EXAMINATION:  NEUROLOGICAL:  General: In no acute distress.   Awake, alert, oriented to person, place, and time.  Pupils equal round and reactive to light.  Facial tone is symmetric.    Strength: Side Biceps Triceps Deltoid Interossei Grip Wrist Ext. Wrist Flex.  R 5 5 5 5 5 5 5   L 5 5 5 5 5 5 5    Incision c/d/I  Imaging:  No new imaging.  Assessment / Plan: Elizabeth [name] is doing well after right ultrasound-guided carpal tunnel release on 10/27/2023.  Overall she is doing very well.  Not getting any more nighttime symptoms.  Has some intermittent wrist soreness.  Is okay to go back to work with restricted duty.  25 pound weight restriction, for the next 4 weeks.  Penne MICAEL Sharps, MD Dept of Neurosurgery

## 2023-12-25 DIAGNOSIS — J329 Chronic sinusitis, unspecified: Secondary | ICD-10-CM | POA: Diagnosis not present

## 2023-12-25 DIAGNOSIS — B9689 Other specified bacterial agents as the cause of diseases classified elsewhere: Secondary | ICD-10-CM | POA: Diagnosis not present

## 2024-01-12 DIAGNOSIS — Z23 Encounter for immunization: Secondary | ICD-10-CM | POA: Diagnosis not present

## 2024-01-12 DIAGNOSIS — Z3042 Encounter for surveillance of injectable contraceptive: Secondary | ICD-10-CM | POA: Diagnosis not present

## 2024-01-19 ENCOUNTER — Other Ambulatory Visit: Payer: Self-pay | Admitting: Internal Medicine

## 2024-01-19 DIAGNOSIS — J329 Chronic sinusitis, unspecified: Secondary | ICD-10-CM

## 2024-01-21 ENCOUNTER — Ambulatory Visit
Admission: RE | Admit: 2024-01-21 | Discharge: 2024-01-21 | Disposition: A | Discharge status: Home / Self Care | Source: Ambulatory Visit | Attending: Internal Medicine | Admitting: Internal Medicine

## 2024-01-21 DIAGNOSIS — J329 Chronic sinusitis, unspecified: Secondary | ICD-10-CM

## 2024-02-22 DIAGNOSIS — Z23 Encounter for immunization: Secondary | ICD-10-CM | POA: Diagnosis not present
